# Patient Record
Sex: Male | Born: 1995 | Race: Black or African American | Hispanic: No | Marital: Single | Smoking: Never smoker
Health system: Southern US, Community
[De-identification: ages and names within clinical notes are randomized; demographics above are authoritative.]

## PROBLEM LIST (undated history)

## (undated) DIAGNOSIS — N200 Calculus of kidney: Secondary | ICD-10-CM

## (undated) DIAGNOSIS — Z8619 Personal history of other infectious and parasitic diseases: Secondary | ICD-10-CM

## (undated) DIAGNOSIS — H6690 Otitis media, unspecified, unspecified ear: Secondary | ICD-10-CM

## (undated) HISTORY — DX: Otitis media, unspecified, unspecified ear: H66.90

## (undated) HISTORY — DX: Personal history of other infectious and parasitic diseases: Z86.19

## (undated) HISTORY — PX: NO PAST SURGERIES: SHX2092

## (undated) HISTORY — PX: MYRINGOTOMY: SUR874

---
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
## 2013-07-01 ENCOUNTER — Encounter (HOSPITAL_COMMUNITY): Payer: Self-pay | Admitting: Emergency Medicine

## 2013-07-01 ENCOUNTER — Emergency Department (HOSPITAL_COMMUNITY)
Admission: EM | Admit: 2013-07-01 | Discharge: 2013-07-01 | Disposition: A | Discharge status: Home / Self Care | Payer: Self-pay | Attending: Emergency Medicine | Admitting: Emergency Medicine

## 2013-07-01 ENCOUNTER — Emergency Department (HOSPITAL_COMMUNITY): Payer: BC Managed Care – PPO

## 2013-07-01 DIAGNOSIS — N200 Calculus of kidney: Secondary | ICD-10-CM | POA: Insufficient documentation

## 2013-07-01 DIAGNOSIS — Z8619 Personal history of other infectious and parasitic diseases: Secondary | ICD-10-CM | POA: Insufficient documentation

## 2013-07-01 DIAGNOSIS — N390 Urinary tract infection, site not specified: Secondary | ICD-10-CM | POA: Insufficient documentation

## 2013-07-01 DIAGNOSIS — Z8669 Personal history of other diseases of the nervous system and sense organs: Secondary | ICD-10-CM | POA: Insufficient documentation

## 2013-07-01 LAB — URINE MICROSCOPIC-ADD ON

## 2013-07-01 LAB — URINALYSIS, ROUTINE W REFLEX MICROSCOPIC
Glucose, UA: NEGATIVE mg/dL
KETONES UR: 15 mg/dL — AB
NITRITE: POSITIVE — AB
PH: 5.5 (ref 5.0–8.0)
PROTEIN: 100 mg/dL — AB
SPECIFIC GRAVITY, URINE: 1.026 (ref 1.005–1.030)
Urobilinogen, UA: 1 mg/dL (ref 0.0–1.0)

## 2013-07-01 LAB — URINE CULTURE
CULTURE: NO GROWTH
Colony Count: NO GROWTH
SPECIAL REQUESTS: NORMAL

## 2013-07-01 LAB — CBC
HEMATOCRIT: 39.3 % (ref 39.0–52.0)
HEMOGLOBIN: 13.3 g/dL (ref 13.0–17.0)
MCH: 30.5 pg (ref 26.0–34.0)
MCHC: 33.8 g/dL (ref 30.0–36.0)
MCV: 90.1 fL (ref 78.0–100.0)
Platelets: 214 10*3/uL (ref 150–400)
RBC: 4.36 MIL/uL (ref 4.22–5.81)
RDW: 13.1 % (ref 11.5–15.5)
WBC: 6.5 10*3/uL (ref 4.0–10.5)

## 2013-07-01 LAB — BASIC METABOLIC PANEL
BUN: 11 mg/dL (ref 6–23)
CO2: 25 mEq/L (ref 19–32)
CREATININE: 0.92 mg/dL (ref 0.50–1.35)
Calcium: 9.5 mg/dL (ref 8.4–10.5)
Chloride: 101 mEq/L (ref 96–112)
GFR calc Af Amer: >90 mL/min (ref >90)
GLUCOSE: 135 mg/dL — AB (ref 70–99)
POTASSIUM: 3.3 meq/L — AB (ref 3.7–5.3)
Sodium: 139 mEq/L (ref 137–147)

## 2013-07-01 MED ORDER — HYDROCODONE-ACETAMINOPHEN 5-325 MG PO TABS: 1.0000 | ORAL_TABLET | Freq: Four times a day (QID) | ORAL | Status: DC | PRN

## 2013-07-01 MED ORDER — KETOROLAC TROMETHAMINE 30 MG/ML IJ SOLN
30.0000 mg | Freq: Once | INTRAMUSCULAR | Status: AC
  Administered 2013-07-01: 30 mg via INTRAVENOUS
  Filled 2013-07-01: qty 1

## 2013-07-01 MED ORDER — TAMSULOSIN HCL 0.4 MG PO CAPS: 0.4000 mg | ORAL_CAPSULE | Freq: Every day | ORAL | Status: DC

## 2013-07-01 MED ORDER — ONDANSETRON HCL 4 MG/2ML IJ SOLN
4.0000 mg | Freq: Once | INTRAMUSCULAR | Status: AC
  Administered 2013-07-01: 4 mg via INTRAVENOUS
  Filled 2013-07-01: qty 2

## 2013-07-01 MED ORDER — CIPROFLOXACIN HCL 500 MG PO TABS: 500.0000 mg | ORAL_TABLET | Freq: Two times a day (BID) | ORAL | Status: DC

## 2013-07-01 MED ORDER — CIPROFLOXACIN HCL 500 MG PO TABS
500.0000 mg | ORAL_TABLET | Freq: Once | ORAL | Status: AC
  Administered 2013-07-01: 500 mg via ORAL
  Filled 2013-07-01: qty 1

## 2013-07-01 MED ORDER — ACETAMINOPHEN 500 MG PO TABS
1000.0000 mg | ORAL_TABLET | Freq: Once | ORAL | Status: AC
  Administered 2013-07-01: 1000 mg via ORAL
  Filled 2013-07-01: qty 2

## 2013-07-01 MED ORDER — DEXTROSE 5 % IV SOLN
1.0000 g | Freq: Once | INTRAVENOUS | Status: AC
  Administered 2013-07-01: 1 g via INTRAVENOUS
  Filled 2013-07-01: qty 10

## 2013-07-01 NOTE — ED Notes (Signed)
Pt c/o of lower abd pain that started this morning, state she has been urinating dark red blood this morning. Pt actively vomiting in triage, first time vomiting. Marland Kitchen

## 2013-07-01 NOTE — Discharge Instructions (Signed)
Kidney Stones Kidney stones (urolithiasis) are deposits that form inside your kidneys. The intense pain is caused by the stone moving through the urinary tract. When the stone moves, the ureter goes into spasm around the stone. The stone is usually passed in the urine.  CAUSES   A disorder that makes certain neck glands produce too much parathyroid hormone (primary hyperparathyroidism).  A buildup of uric acid crystals, similar to gout in your joints.  Narrowing (stricture) of the ureter.  A kidney obstruction present at birth (congenital obstruction).  Previous surgery on the kidney or ureters.  Numerous kidney infections. SYMPTOMS   Feeling sick to your stomach (nauseous).  Throwing up (vomiting).  Blood in the urine (hematuria).  Pain that usually spreads (radiates) to the groin.  Frequency or urgency of urination. DIAGNOSIS   Taking a history and physical exam.  Blood or urine tests.  CT scan.  Occasionally, an examination of the inside of the urinary bladder (cystoscopy) is performed. TREATMENT   Observation.  Increasing your fluid intake.  Extracorporeal shock wave lithotripsy This is a noninvasive procedure that uses shock waves to break up kidney stones.  Surgery may be needed if you have severe pain or persistent obstruction. There are various surgical procedures. Most of the procedures are performed with the use of small instruments. Only small incisions are needed to accommodate these instruments, so recovery time is minimized. The size, location, and chemical composition are all important variables that will determine the proper choice of action for you. Talk to your health care provider to better understand your situation so that you will minimize the risk of injury to yourself and your kidney.  HOME CARE INSTRUCTIONS   Drink enough water and fluids to keep your urine clear or pale yellow. This will help you to pass the stone or stone fragments.  Strain  all urine through the provided strainer. Keep all particulate matter and stones for your health care provider to see. The stone causing the pain may be as small as a grain of salt. It is very important to use the strainer each and every time you pass your urine. The collection of your stone will allow your health care provider to analyze it and verify that a stone has actually passed. The stone analysis will often identify what you can do to reduce the incidence of recurrences.  Only take over-the-counter or prescription medicines for pain, discomfort, or fever as directed by your health care provider.  Make a follow-up appointment with your health care provider as directed.  Get follow-up X-rays if required. The absence of pain does not always mean that the stone has passed. It may have only stopped moving. If the urine remains completely obstructed, it can cause loss of kidney function or even complete destruction of the kidney. It is your responsibility to make sure X-rays and follow-ups are completed. Ultrasounds of the kidney can show blockages and the status of the kidney. Ultrasounds are not associated with any radiation and can be performed easily in a matter of minutes. SEEK MEDICAL CARE IF:  You experience pain that is progressive and unresponsive to any pain medicine you have been prescribed. SEEK IMMEDIATE MEDICAL CARE IF:   Pain cannot be controlled with the prescribed medicine.  You have a fever or shaking chills.  The severity or intensity of pain increases over 18 hours and is not relieved by pain medicine.  You develop a new onset of abdominal pain.  You feel faint or pass   out.  You are unable to urinate. MAKE SURE YOU:   Understand these instructions.  Will watch your condition.  Will get help right away if you are not doing well or get worse. Document Released: 01/24/2005 Document Revised: 09/26/2012 Document Reviewed: 06/27/2012 [employer] Patient Information 2014  [employer] Urinary Tract Infection Urinary tract infections (UTIs) can develop anywhere along your urinary tract. Your urinary tract is your body's drainage system for removing wastes and extra water. Your urinary tract includes two kidneys, two ureters, a bladder, and a urethra. Your kidneys are a pair of bean-shaped organs. Each kidney is about the size of your fist. They are located below your ribs, one on each side of your spine. CAUSES Infections are caused by microbes, which are microscopic organisms, including fungi, viruses, and bacteria. These organisms are so small that they can only be seen through a microscope. Bacteria are the microbes that most commonly cause UTIs. SYMPTOMS  Symptoms of UTIs may vary by age and gender of the patient and by the location of the infection. Symptoms in young women typically include a frequent and intense urge to urinate and a painful, burning feeling in the bladder or urethra during urination. Older women and men are more likely to be tired, shaky, and weak and have muscle aches and abdominal pain. A fever may mean the infection is in your kidneys. Other symptoms of a kidney infection include pain in your back or sides below the ribs, nausea, and vomiting. DIAGNOSIS To diagnose a UTI, your caregiver will ask you about your symptoms. Your caregiver also will ask to provide a urine sample. The urine sample will be tested for bacteria and white blood cells. White blood cells are made by your body to help fight infection. TREATMENT  Typically, UTIs can be treated with medication. Because most UTIs are caused by a bacterial infection, they usually can be treated with the use of antibiotics. The choice of antibiotic and length of treatment depend on your symptoms and the type of bacteria causing your infection. HOME CARE INSTRUCTIONS  If you were prescribed antibiotics, take them exactly as your caregiver instructs you. Finish the medication even if you feel  better after you have only taken some of the medication.  Drink enough water and fluids to keep your urine clear or pale yellow.  Avoid caffeine, tea, and carbonated beverages. They tend to irritate your bladder.  Empty your bladder often. Avoid holding urine for long periods of time.  Empty your bladder before and after sexual intercourse.  After a bowel movement, women should cleanse from front to back. Use each tissue only once. SEEK MEDICAL CARE IF:   You have back pain.  You develop a fever.  Your symptoms do not begin to resolve within 3 days. SEEK IMMEDIATE MEDICAL CARE IF:   You have severe back pain or lower abdominal pain.  You develop chills.  You have nausea or vomiting.  You have continued burning or discomfort with urination. MAKE SURE YOU:   Understand these instructions.  Will watch your condition.  Will get help right away if you are not doing well or get worse. Document Released: 11/03/2004 Document Revised: 07/26/2011 Document Reviewed: 03/04/2011 [employer] Patient Information 2014 [employer]  

## 2013-07-01 NOTE — ED Provider Notes (Signed)
CSN: [id number]     Arrival date & time 07/01/13  0825 History   First MD Initiated Contact with Patient 07/01/13 [phone]     Chief Complaint  Patient presents with  . Hematuria  . Abdominal Pain     (Consider location/radiation/quality/duration/timing/severity/associated sxs/prior Treatment) HPI Comments: Hematuria began this morning. Started as yellow urine, then mild worsening with blood. Urine in room not frank blood.  Also having lower abdominal pain, suprapubic, across lower abdomen. Stated some mild R sided lower back pain this morning, which is now gone. Denies any prior back pain, dysuria, hematuria. Went to bed last night without any problems. No family hx of kidney stones. Patient without any current or chronic medical issues.  Patient is a 18 y.o. male presenting with hematuria and abdominal pain. The history is provided by the patient.  Hematuria This is a new problem. The current episode started 3 to 5 hours ago. The problem occurs constantly. The problem has been gradually worsening. Associated symptoms include abdominal pain (lower abdomen). Pertinent negatives include no chest pain, no headaches and no shortness of breath. Nothing aggravates the symptoms. Nothing relieves the symptoms. He has tried nothing for the symptoms.  Abdominal Pain Pain location:  Suprapubic, LLQ and RLQ Pain quality: aching   Pain radiates to:  Does not radiate Associated symptoms: hematuria, nausea and vomiting (x1, just prior to arrival)   Associated symptoms: no chest pain, no constipation, no diarrhea, no fever and no shortness of breath     Past Medical History  Diagnosis Date  . Otitis media     hospitalized as young child  . History of chicken pox     age 70   Past Surgical History  Procedure Laterality Date  . No past surgeries    . Myringotomy      age 78   Family History  Problem Relation Age of Onset  . Diabetes Maternal Grandmother   . Hypertension Maternal Grandmother   .  Arthritis Maternal Grandmother   . Heart murmur Maternal Grandmother   . Heart disease Neg Hx   . Stroke Neg Hx   . Cancer Other     prostate   History  Substance Use Topics  . Smoking status: Never Smoker   . Smokeless tobacco: Never Used  . Alcohol Use: No    Review of Systems  Constitutional: Negative for fever.  Respiratory: Negative for shortness of breath.   Cardiovascular: Negative for chest pain.  Gastrointestinal: Positive for nausea, vomiting (x1, just prior to arrival) and abdominal pain (lower abdomen). Negative for diarrhea, constipation and blood in stool.  Genitourinary: Positive for hematuria.  Neurological: Negative for headaches.  All other systems reviewed and are negative.     Allergies  Review of patient's allergies indicates no known allergies.  Home Medications   Prior to Admission medications   Not on File   BP 151/90  Pulse 71  Resp 22  SpO2 100% Physical Exam  Nursing note and vitals reviewed. Constitutional: He is oriented to person, place, and time. He appears well-developed and well-nourished. No distress.  HENT:  Head: Normocephalic and atraumatic.  Mouth/Throat: No oropharyngeal exudate.  Eyes: EOM are normal. Pupils are equal, round, and reactive to light.  Neck: Normal range of motion. Neck supple.  Cardiovascular: Normal rate and regular rhythm.  Exam reveals no friction rub.   No murmur heard. Pulmonary/Chest: Effort normal and breath sounds normal. No respiratory distress. He has no wheezes. He has no rales.  Abdominal: Soft. He exhibits no distension. There is tenderness (suprapubic, mild bilateral lower quadrants). There is no rebound.  Genitourinary: Testes normal and penis normal. Right testis shows no mass, no swelling and no tenderness. Right testis is descended. Left testis shows no mass, no swelling and no tenderness. Left testis is descended.  Musculoskeletal: Normal range of motion. He exhibits no edema.  Neurological:  He is alert and oriented to person, place, and time.  Skin: He is not diaphoretic.    ED Course  Procedures (including critical care time) Labs Review Labs Reviewed  BASIC METABOLIC PANEL - Abnormal; Notable for the following:    Potassium 3.3 (*)    Glucose, Bld 135 (*)    All other components within normal limits  URINALYSIS, ROUTINE W REFLEX MICROSCOPIC - Abnormal; Notable for the following:    Color, Urine RED (*)    APPearance TURBID (*)    Hgb urine dipstick LARGE (*)    Bilirubin Urine MODERATE (*)    Ketones, ur 15 (*)    Protein, ur 100 (*)    Nitrite POSITIVE (*)    Leukocytes, UA MODERATE (*)    All other components within normal limits  URINE MICROSCOPIC-ADD ON - Abnormal; Notable for the following:    Bacteria, UA FEW (*)    All other components within normal limits  URINE CULTURE  CBC    Imaging Review Ct Abdomen Pelvis Wo Contrast  07/01/2013   CLINICAL DATA:  Lower abdominal pain.  Hematuria.  EXAM: CT ABDOMEN AND PELVIS WITHOUT CONTRAST  TECHNIQUE: Multidetector CT imaging of the abdomen and pelvis was performed following the standard protocol without IV contrast.  COMPARISON:  Ultrasound dated 07/01/2013  FINDINGS: There is a 4 mm stone obstructing the distal right ureter just proximal to the ureterovesical junction. There is faint calcification in the medullary pyramids bilaterally suggesting medullary sponge kidney. There is a 3 mm stone in the mid left kidney.  Liver, biliary tree, spleen, pancreas, and adrenal glands are normal. There is a tiny amount of free fluid in the pelvis. Bowel is normal. No osseous abnormality.  IMPRESSION: 1. 4 mm stone obstructing the distal right ureter. 2. 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 3. 3 mm stone in the mid left kidney.   Electronically Signed   By: Geanie CooleyJim  Maxwell M.D.   On: 07/01/2013 10:10   [place] Renal  07/01/2013   CLINICAL DATA:  Hematuria and abdominal pain evaluate for hydronephrosis  EXAM: RENAL/URINARY TRACT ULTRASOUND  COMPLETE  COMPARISON:  None.  FINDINGS: Right Kidney:  Length: 11.9 cm.  Mild right hydronephrosis.  No focal solid lesion.  Left Kidney:  Length: 10.8 cm. Echogenicity within normal limits. No mass or hydronephrosis visualized.  Bladder:  The bladder is empty. The patient voided immediately prior to the examination.  IMPRESSION: Mild right-sided hydronephrosis. In the setting of abdominal pain and hematuria, this is concerning for an at least partially obstructing ureteral stone. A CT scan of the abdomen and pelvis is ordered and pending.   Electronically Signed   By: Malachy MoanHeath  McCullough M.D.   On: 07/01/2013 09:54     EKG Interpretation None      MDM   Final diagnoses:  Kidney stone  UTI (lower urinary tract infection)    77M presents with lower abdominal pain. Began this morning. Suprapubic, with some hematuria. States pain across lower abdomen. Had mild R lower back pain this morning, gone now. Associated vomiting x1, just prior to arrival. No fevers. AFVSS here.  On exam, GU exam normal. Suprapubic pain, mild lower abdominal pain in right and left lower quadrants. No epigastric pain.  Concern for possible kidney stone. Will start with [place] of kidneys to look for possible hydronephrosis, check his urine and basic labs.  CT shows 4mm R UPJ stone. Possible medullary sponge kidney also. I spoke with Dr. Brunilda Payor, recommends antibiotics, f/u with him later this week. Will give antibiotics, flomax, pain meds. Stable for discharge.  I have reviewed all labs and imaging and considered them in my medical decision making.   Dagmar Hait, MD 07/01/13 1537

## 2013-07-01 NOTE — ED Notes (Signed)
US at bedside

## 2014-02-11 ENCOUNTER — Emergency Department (HOSPITAL_COMMUNITY)
Admission: EM | Admit: 2014-02-11 | Discharge: 2014-02-11 | Disposition: A | Discharge status: Home / Self Care | Payer: Medicaid Other | Attending: Dermatology | Admitting: Dermatology

## 2014-02-11 ENCOUNTER — Encounter (HOSPITAL_COMMUNITY): Payer: Self-pay | Admitting: Emergency Medicine

## 2014-02-11 DIAGNOSIS — Z8669 Personal history of other diseases of the nervous system and sense organs: Secondary | ICD-10-CM | POA: Insufficient documentation

## 2014-02-11 DIAGNOSIS — R112 Nausea with vomiting, unspecified: Secondary | ICD-10-CM

## 2014-02-11 DIAGNOSIS — Z792 Long term (current) use of antibiotics: Secondary | ICD-10-CM | POA: Insufficient documentation

## 2014-02-11 DIAGNOSIS — Z87442 Personal history of urinary calculi: Secondary | ICD-10-CM | POA: Insufficient documentation

## 2014-02-11 DIAGNOSIS — K529 Noninfective gastroenteritis and colitis, unspecified: Secondary | ICD-10-CM

## 2014-02-11 DIAGNOSIS — Z79899 Other long term (current) drug therapy: Secondary | ICD-10-CM | POA: Insufficient documentation

## 2014-02-11 DIAGNOSIS — Z8619 Personal history of other infectious and parasitic diseases: Secondary | ICD-10-CM | POA: Insufficient documentation

## 2014-02-11 HISTORY — DX: Calculus of kidney: N20.0

## 2014-02-11 LAB — COMPREHENSIVE METABOLIC PANEL
ALBUMIN: 4.8 g/dL (ref 3.5–5.2)
ALT: 17 U/L (ref 0–53)
ANION GAP: 11 (ref 5–15)
AST: 32 U/L (ref 0–37)
Alkaline Phosphatase: 108 U/L (ref 39–117)
BILIRUBIN TOTAL: 1.7 mg/dL — AB (ref 0.3–1.2)
BUN: 12 mg/dL (ref 6–23)
CO2: 23 mmol/L (ref 19–32)
CREATININE: 0.93 mg/dL (ref 0.50–1.35)
Calcium: 9.7 mg/dL (ref 8.4–10.5)
Chloride: 103 mEq/L (ref 96–112)
GLUCOSE: 90 mg/dL (ref 70–99)
Potassium: 3.8 mmol/L (ref 3.5–5.1)
SODIUM: 137 mmol/L (ref 135–145)
Total Protein: 8.6 g/dL — ABNORMAL HIGH (ref 6.0–8.3)

## 2014-02-11 LAB — CBC WITH DIFFERENTIAL/PLATELET
Basophils Absolute: 0 10*3/uL (ref 0.0–0.1)
Basophils Relative: 0 % (ref 0–1)
EOS PCT: 0 % (ref 0–5)
Eosinophils Absolute: 0 10*3/uL (ref 0.0–0.7)
HEMATOCRIT: 39.1 % (ref 39.0–52.0)
HEMOGLOBIN: 13.2 g/dL (ref 13.0–17.0)
LYMPHS PCT: 9 % — AB (ref 12–46)
Lymphs Abs: 0.6 10*3/uL — ABNORMAL LOW (ref 0.7–4.0)
MCH: 30 pg (ref 26.0–34.0)
MCHC: 33.8 g/dL (ref 30.0–36.0)
MCV: 88.9 fL (ref 78.0–100.0)
MONO ABS: 0.4 10*3/uL (ref 0.1–1.0)
MONOS PCT: 7 % (ref 3–12)
NEUTROS ABS: 5.1 10*3/uL (ref 1.7–7.7)
Neutrophils Relative %: 84 % — ABNORMAL HIGH (ref 43–77)
Platelets: 229 10*3/uL (ref 150–400)
RBC: 4.4 MIL/uL (ref 4.22–5.81)
RDW: 13.1 % (ref 11.5–15.5)
WBC: 6.1 10*3/uL (ref 4.0–10.5)

## 2014-02-11 LAB — LIPASE, BLOOD: Lipase: 21 U/L (ref 11–59)

## 2014-02-11 MED ORDER — ONDANSETRON HCL 4 MG/2ML IJ SOLN
4.0000 mg | Freq: Once | INTRAMUSCULAR | Status: AC
  Administered 2014-02-11: 4 mg via INTRAVENOUS
  Filled 2014-02-11: qty 2

## 2014-02-11 MED ORDER — SODIUM CHLORIDE 0.9 % IV BOLUS (SEPSIS)
1000.0000 mL | Freq: Once | INTRAVENOUS | Status: AC
  Administered 2014-02-11: 1000 mL via INTRAVENOUS

## 2014-02-11 MED ORDER — HYDROMORPHONE HCL 1 MG/ML IJ SOLN
0.5000 mg | Freq: Once | INTRAMUSCULAR | Status: AC
  Administered 2014-02-11: 0.5 mg via INTRAVENOUS
  Filled 2014-02-11: qty 1

## 2014-02-11 MED ORDER — OXYCODONE-ACETAMINOPHEN 5-325 MG PO TABS: 1.0000 | ORAL_TABLET | Freq: Once | ORAL | Status: DC

## 2014-02-11 MED ORDER — ONDANSETRON 4 MG PO TBDP
8.0000 mg | ORAL_TABLET | Freq: Once | ORAL | Status: AC
  Administered 2014-02-11: 8 mg via ORAL
  Filled 2014-02-11: qty 2

## 2014-02-11 MED ORDER — PANTOPRAZOLE SODIUM 40 MG IV SOLR
40.0000 mg | Freq: Once | INTRAVENOUS | Status: AC
  Administered 2014-02-11: 40 mg via INTRAVENOUS
  Filled 2014-02-11: qty 40

## 2014-02-11 NOTE — ED Notes (Signed)
Cup of orange juice given to pt per request.  Sts nausea is better.

## 2014-02-11 NOTE — ED Provider Notes (Signed)
CSN: [id number]     Arrival date & time 02/11/14  1406 History   First MD Initiated Contact with Patient 02/11/14 1742     Chief Complaint  Patient presents with  . Emesis     (Consider location/radiation/quality/duration/timing/severity/associated sxs/prior Treatment) Patient is a 19 y.o. male presenting with vomiting. The history is provided by the patient and a relative.  Emesis Associated symptoms: no abdominal pain, no diarrhea, no headaches and no sore throat   pt c/o several episodes nv onset today.  Emesis clear to yellowish, not bloody or bilious. No diarrhea. Had normal bm yesterday. No abd distension. Crampy, diffuse, intermittent abd pain, that occasionally feels toward mid back.  No hx pud. No hx gallstones or pancreatitis. No constant and/or focal abd pain. No fever or chills. States had left a subway sandwich out for a few hrs before eating last pm, otherwise, no known bad food ingestion or ill contacts. No prior abd surgery. Denies cough or uri c/o. No chest pain or sob.      Past Medical History  Diagnosis Date  . Otitis media     hospitalized as young child  . History of chicken pox     age 80  . Kidney stones    Past Surgical History  Procedure Laterality Date  . No past surgeries    . Myringotomy      age 73   Family History  Problem Relation Age of Onset  . Diabetes Maternal Grandmother   . Hypertension Maternal Grandmother   . Arthritis Maternal Grandmother   . Heart murmur Maternal Grandmother   . Heart disease Neg Hx   . Stroke Neg Hx   . Cancer Other     prostate   History  Substance Use Topics  . Smoking status: Never Smoker   . Smokeless tobacco: Never Used  . Alcohol Use: No    Review of Systems  Constitutional: Negative for fever.  HENT: Negative for sore throat.   Eyes: Negative for redness.  Respiratory: Negative for cough and shortness of breath.   Cardiovascular: Negative for chest pain.  Gastrointestinal: Positive for nausea and  vomiting. Negative for abdominal pain, diarrhea, constipation and abdominal distention.  Endocrine: Negative for polyuria.  Genitourinary: Negative for dysuria, hematuria, flank pain, scrotal swelling and testicular pain.  Musculoskeletal: Negative for back pain and neck pain.  Skin: Negative for rash.  Neurological: Negative for headaches.  Hematological: Does not bruise/bleed easily.  Psychiatric/Behavioral: Negative for confusion.      Allergies  Review of patient's allergies indicates no known allergies.  Home Medications   Prior to Admission medications   Medication Sig Start Date End Date Taking? Authorizing Provider  ciprofloxacin (CIPRO) 500 MG tablet Take 1 tablet (500 mg total) by mouth 2 (two) times daily. 07/01/13   Elwin Mocha, MD  HYDROcodone-acetaminophen (NORCO/VICODIN) 5-325 MG per tablet Take 1 tablet by mouth every 6 (six) hours as needed for moderate pain. 07/01/13   Elwin Mocha, MD  tamsulosin (FLOMAX) 0.4 MG CAPS capsule Take 1 capsule (0.4 mg total) by mouth daily. 07/01/13   Elwin Mocha, MD   BP 99/75 mmHg  Pulse 92  Temp(Src) 99.4 F (37.4 C) (Oral)  Resp 16  SpO2 100% Physical Exam  Constitutional: He is oriented to person, place, and time. He appears well-developed and well-nourished. No distress.  HENT:  Mouth/Throat: Oropharynx is clear and moist.  Eyes: Conjunctivae are normal. No scleral icterus.  Neck: Neck supple. No tracheal deviation present.  No stiffness or rigidity  Cardiovascular: Normal rate, regular rhythm, normal heart sounds and intact distal pulses.  Exam reveals no gallop and no friction rub.   No murmur heard. Pulmonary/Chest: Effort normal and breath sounds normal. No accessory muscle usage. No respiratory distress.  Abdominal: Soft. Bowel sounds are normal. He exhibits no distension and no mass. There is tenderness. There is no rebound and no guarding.  Mild epigastric tenderness. No hernias.   Genitourinary:  No cva tenderness   Musculoskeletal: Normal range of motion.  Neurological: He is alert and oriented to person, place, and time.  Skin: Skin is warm and dry. He is not diaphoretic.  Psychiatric: He has a normal mood and affect.  Nursing note and vitals reviewed.   ED Course  Procedures (including critical care time) Labs Review  Results for orders placed or performed during the hospital encounter of 02/11/14  Comprehensive metabolic panel  Result Value Ref Range   Sodium 137 135 - 145 mmol/L   Potassium 3.8 3.5 - 5.1 mmol/L   Chloride 103 96 - 112 mEq/L   CO2 23 19 - 32 mmol/L   Glucose, Bld 90 70 - 99 mg/dL   BUN 12 6 - 23 mg/dL   Creatinine, Ser 1.610.93 0.50 - 1.35 mg/dL   Calcium 9.7 8.4 - 09.610.5 mg/dL   Total Protein 8.6 (H) 6.0 - 8.3 g/dL   Albumin 4.8 3.5 - 5.2 g/dL   AST 32 0 - 37 U/L   ALT 17 0 - 53 U/L   Alkaline Phosphatase 108 39 - 117 U/L   Total Bilirubin 1.7 (H) 0.3 - 1.2 mg/dL   GFR calc non Af Amer >90 >90 mL/min   GFR calc Af Amer >90 >90 mL/min   Anion gap 11 5 - 15  CBC with Differential  Result Value Ref Range   WBC 6.1 4.0 - 10.5 K/uL   RBC 4.40 4.22 - 5.81 MIL/uL   Hemoglobin 13.2 13.0 - 17.0 g/dL   HCT 04.539.1 40.939.0 - 81.152.0 %   MCV 88.9 78.0 - 100.0 fL   MCH 30.0 26.0 - 34.0 pg   MCHC 33.8 30.0 - 36.0 g/dL   RDW 91.413.1 78.211.5 - 95.615.5 %   Platelets 229 150 - 400 K/uL   Neutrophils Relative % 84 (H) 43 - 77 %   Neutro Abs 5.1 1.7 - 7.7 K/uL   Lymphocytes Relative 9 (L) 12 - 46 %   Lymphs Abs 0.6 (L) 0.7 - 4.0 K/uL   Monocytes Relative 7 3 - 12 %   Monocytes Absolute 0.4 0.1 - 1.0 K/uL   Eosinophils Relative 0 0 - 5 %   Eosinophils Absolute 0.0 0.0 - 0.7 K/uL   Basophils Relative 0 0 - 1 %   Basophils Absolute 0.0 0.0 - 0.1 K/uL  Lipase, blood  Result Value Ref Range   Lipase 21 11 - 59 U/L     MDM   Labs.  Iv ns bolus.  Dilaudid .5 mg iv. zofran iv. protonix iv.  Reviewed nursing notes and prior charts for additional history.   Recheck, no pain. Abd soft nt.   Tolerating po fluids. Feels improved.  Pt currently appears stable for d/c.      Suzi RootsKevin E Danyel Griess, MD 02/11/14 [id number]

## 2014-02-11 NOTE — ED Notes (Signed)
Pt tolerating PO fluids. Denies N/V at this time.

## 2014-02-11 NOTE — Discharge Instructions (Signed)
It was our pleasure to provide your ER care today - we hope that you feel better.  Rest. Drink plenty of fluids.  Advance diet slowly as tolerated.  Follow up with primary care doctor, or here, in 1 days time for recheck if symptoms fail to improve/resolve.  Return to ER if worse, new symptoms, worsening or severe abdominal pain, persistent vomiting, other concern.  You were given pain medication in the ER - no driving for the next 4 hours.     Nausea and Vomiting Nausea is a sick feeling that often comes before throwing up (vomiting). Vomiting is a reflex where stomach contents come out of your mouth. Vomiting can cause severe loss of body fluids (dehydration). Children and elderly adults can become dehydrated quickly, especially if they also have diarrhea. Nausea and vomiting are symptoms of a condition or disease. It is important to find the cause of your symptoms. CAUSES   Direct irritation of the stomach lining. This irritation can result from increased acid production (gastroesophageal reflux disease), infection, food poisoning, taking certain medicines (such as nonsteroidal anti-inflammatory drugs), alcohol use, or tobacco use.  Signals from the brain.These signals could be caused by a headache, heat exposure, an inner ear disturbance, increased pressure in the brain from injury, infection, a tumor, or a concussion, pain, emotional stimulus, or metabolic problems.  An obstruction in the gastrointestinal tract (bowel obstruction).  Illnesses such as diabetes, hepatitis, gallbladder problems, appendicitis, kidney problems, cancer, sepsis, atypical symptoms of a heart attack, or eating disorders.  Medical treatments such as chemotherapy and radiation.  Receiving medicine that makes you sleep (general anesthetic) during surgery. DIAGNOSIS Your caregiver may ask for tests to be done if the problems do not improve after a few days. Tests may also be done if symptoms are severe or if  the reason for the nausea and vomiting is not clear. Tests may include:  Urine tests.  Blood tests.  Stool tests.  Cultures (to look for evidence of infection).  X-rays or other imaging studies. Test results can help your caregiver make decisions about treatment or the need for additional tests. TREATMENT You need to stay well hydrated. Drink frequently but in small amounts.You may wish to drink water, sports drinks, clear broth, or eat frozen ice pops or gelatin dessert to help stay hydrated.When you eat, eating slowly may help prevent nausea.There are also some antinausea medicines that may help prevent nausea. HOME CARE INSTRUCTIONS   Take all medicine as directed by your caregiver.  If you do not have an appetite, do not force yourself to eat. However, you must continue to drink fluids.  If you have an appetite, eat a normal diet unless your caregiver tells you differently.  Eat a variety of complex carbohydrates (rice, wheat, potatoes, bread), lean meats, yogurt, fruits, and vegetables.  Avoid high-fat foods because they are more difficult to digest.  Drink enough water and fluids to keep your urine clear or pale yellow.  If you are dehydrated, ask your caregiver for specific rehydration instructions. Signs of dehydration may include:  Severe thirst.  Dry lips and mouth.  Dizziness.  Dark urine.  Decreasing urine frequency and amount.  Confusion.  Rapid breathing or pulse. SEEK IMMEDIATE MEDICAL CARE IF:   You have blood or brown flecks (like coffee grounds) in your vomit.  You have black or bloody stools.  You have a severe headache or stiff neck.  You are confused.  You have severe abdominal pain.  You have chest  pain or trouble breathing.  You do not urinate at least once every 8 hours.  You develop cold or clammy skin.  You continue to vomit for longer than 24 to 48 hours.  You have a fever. MAKE SURE YOU:   Understand these  instructions.  Will watch your condition.  Will get help right away if you are not doing well or get worse. Document Released: 01/24/2005 Document Revised: 04/18/2011 Document Reviewed: 06/23/2010 [facility] Patient Information 2015 [place], [place]. This information is not intended to replace advice given to you by your health care provider. Make sure you discuss any questions you have with your health care provider.    Viral Gastroenteritis Viral gastroenteritis is also known as stomach flu. This condition affects the stomach and intestinal tract. It can cause sudden diarrhea and vomiting. The illness typically lasts 3 to 8 days. Most people develop an immune response that eventually gets rid of the virus. While this natural response develops, the virus can make you quite ill. CAUSES  Many different viruses can cause gastroenteritis, such as rotavirus or noroviruses. You can catch one of these viruses by consuming contaminated food or water. You may also catch a virus by sharing utensils or other personal items with an infected person or by touching a contaminated surface. SYMPTOMS  The most common symptoms are diarrhea and vomiting. These problems can cause a severe loss of body fluids (dehydration) and a body salt (electrolyte) imbalance. Other symptoms may include:  Fever.  Headache.  Fatigue.  Abdominal pain. DIAGNOSIS  Your caregiver can usually diagnose viral gastroenteritis based on your symptoms and a physical exam. A stool sample may also be taken to test for the presence of viruses or other infections. TREATMENT  This illness typically goes away on its own. Treatments are aimed at rehydration. The most serious cases of viral gastroenteritis involve vomiting so severely that you are not able to keep fluids down. In these cases, fluids must be given through an intravenous line (IV). HOME CARE INSTRUCTIONS   Drink enough fluids to keep your urine clear or pale yellow. Drink small  amounts of fluids frequently and increase the amounts as tolerated.  Ask your caregiver for specific rehydration instructions.  Avoid:  Foods high in sugar.  Alcohol.  Carbonated drinks.  Tobacco.  Juice.  Caffeine drinks.  Extremely hot or cold fluids.  Fatty, greasy foods.  Too much intake of anything at one time.  Dairy products until 24 to 48 hours after diarrhea stops.  You may consume probiotics. Probiotics are active cultures of beneficial bacteria. They may lessen the amount and number of diarrheal stools in adults. Probiotics can be found in yogurt with active cultures and in supplements.  Wash your hands well to avoid spreading the virus.  Only take over-the-counter or prescription medicines for pain, discomfort, or fever as directed by your caregiver. Do not give aspirin to children. Antidiarrheal medicines are not recommended.  Ask your caregiver if you should continue to take your regular prescribed and over-the-counter medicines.  Keep all follow-up appointments as directed by your caregiver. SEEK IMMEDIATE MEDICAL CARE IF:   You are unable to keep fluids down.  You do not urinate at least once every 6 to 8 hours.  You develop shortness of breath.  You notice blood in your stool or vomit. This may look like coffee grounds.  You have abdominal pain that increases or is concentrated in one small area (localized).  You have persistent vomiting or diarrhea.  You have a fever.  The patient is a child younger than 3 months, and he or she has a fever.  The patient is a child older than 3 months, and he or she has a fever and persistent symptoms.  The patient is a child older than 3 months, and he or she has a fever and symptoms suddenly get worse.  The patient is a baby, and he or she has no tears when crying. MAKE SURE YOU:   Understand these instructions.  Will watch your condition.  Will get help right away if you are not doing well or get  worse. Document Released: 01/24/2005 Document Revised: 04/18/2011 Document Reviewed: 11/10/2010 [facility] Patient Information 2015 [place], [place]. This information is not intended to replace advice given to you by your health care provider. Make sure you discuss any questions you have with your health care provider.

## 2014-02-11 NOTE — ED Notes (Signed)
Pt has been vomiting, diaphoretic, chills, anorexic. This started this morning around 0400.

## 2014-05-01 ENCOUNTER — Telehealth: Payer: Self-pay | Admitting: Family

## 2014-05-01 ENCOUNTER — Ambulatory Visit (INDEPENDENT_AMBULATORY_CARE_PROVIDER_SITE_OTHER): Payer: BLUE CROSS/BLUE SHIELD | Admitting: Family

## 2014-05-01 ENCOUNTER — Encounter: Payer: Self-pay | Admitting: Family

## 2014-05-01 VITALS — BP 109/66 | HR 47 | Temp 98.2°F | Resp 16 | Ht 71.0 in | Wt 160.8 lb

## 2014-05-01 DIAGNOSIS — K219 Gastro-esophageal reflux disease without esophagitis: Secondary | ICD-10-CM

## 2014-05-01 DIAGNOSIS — R110 Nausea: Secondary | ICD-10-CM

## 2014-05-01 DIAGNOSIS — D72819 Decreased white blood cell count, unspecified: Secondary | ICD-10-CM

## 2014-05-01 DIAGNOSIS — R001 Bradycardia, unspecified: Secondary | ICD-10-CM | POA: Insufficient documentation

## 2014-05-01 LAB — BASIC METABOLIC PANEL
BUN: 14 mg/dL (ref 6–23)
CO2: 27 meq/L (ref 19–32)
CREATININE: 0.94 mg/dL (ref 0.40–1.50)
Calcium: 9.5 mg/dL (ref 8.4–10.5)
Chloride: 106 mEq/L (ref 96–112)
GFR: 130.8 mL/min (ref >60.00)
Glucose, Bld: 93 mg/dL (ref 70–99)
Potassium: 4.6 mEq/L (ref 3.5–5.1)
Sodium: 138 mEq/L (ref 135–145)

## 2014-05-01 LAB — CBC
HCT: 41.8 % (ref 39.0–52.0)
HEMOGLOBIN: 14 g/dL (ref 13.0–17.0)
MCHC: 33.4 g/dL (ref 30.0–36.0)
MCV: 90.5 fl (ref 78.0–100.0)
Platelets: 223 10*3/uL (ref 150.0–400.0)
RBC: 4.62 Mil/uL (ref 4.22–5.81)
RDW: 13.8 % (ref 11.5–14.6)
WBC: 2.6 10*3/uL — AB (ref 4.5–10.5)

## 2014-05-01 LAB — TSH: TSH: 0.8 u[IU]/mL (ref 0.35–5.50)

## 2014-05-01 MED ORDER — RANITIDINE HCL 150 MG PO TABS: 150.0000 mg | ORAL_TABLET | Freq: Two times a day (BID) | ORAL | Status: DC

## 2014-05-01 NOTE — Progress Notes (Signed)
   Subjective:    Patient ID: Thomas Stanley, male    DOB: June 04, 1995, 19 y.o.   MRN: [id number]  HPI   Pt present today with report of episode of Diaphoresis and nausea with standing. Reports symptoms lasts 8-9 minutes. Reports intermittent "bubbly" "hot" abdominal discomfort. Denies associated vomitting, nausea or dizziness.  Yesterday had several episodes of diaphoresis with standing.  Reports that he went to the ED on 1/5 with vomiting. Notes that every time he stood up he would vomit during that episode. Symptoms then resolved until yesterday.   This time symptoms are less severe, just nausea- no vomiting with standing. Reports Normal stool.  Denies bloody/coffee ground emesis.    Only eats one time a day (fast food)   Review of Systems See HPI  Past Medical History  Diagnosis Date  . Otitis media     hospitalized as young child  . History of chicken pox     age 89  . Kidney stones     History   Social History  . Marital Status: Single    Spouse Name: N/A  . Number of Children: N/A  . Years of Education: N/A   Occupational History  . Not on file.   Social History Main Topics  . Smoking status: Never Smoker   . Smokeless tobacco: Never Used  . Alcohol Use: No  . Drug Use: No  . Sexual Activity: No     Comment: senior in high school, basektball, track   Other Topics Concern  . Not on file   Social History Narrative   Regular exercise:  Plays basketball 2-3 days weekly   Trying to get into [employer]   Not currently working   Lives with mom, step dad, older brother             Past Surgical History  Procedure Laterality Date  . No past surgeries    . Myringotomy      age 19    Family History  Problem Relation Age of Onset  . Diabetes Maternal Grandmother   . Hypertension Maternal Grandmother   . Arthritis Maternal Grandmother   . Heart murmur Maternal Grandmother   . Heart disease Neg Hx   . Stroke Neg Hx   . Cancer Other     prostate    No Known  Allergies  No current outpatient prescriptions on file prior to visit.   No current facility-administered medications on file prior to visit.    BP 109/66 mmHg  Pulse 47  Temp(Src) 98.2 F (36.8 C) (Oral)  Resp 16  Ht 5\' 11"  (1.803 m)  Wt 160 lb 12.8 oz (72.938 kg)  BMI 22.44 kg/m2  SpO2 99%       Objective:   Physical Exam  Constitutional: He is oriented to person, place, and time. He appears well-developed and well-nourished. No distress.  HENT:  Head: Normocephalic and atraumatic.  Cardiovascular: Regular rhythm.  Bradycardia present.   No murmur heard. Pulmonary/Chest: Effort normal and breath sounds normal. No respiratory distress. He has no wheezes. He has no rales. He exhibits no tenderness.  Neurological: He is alert and oriented to person, place, and time.  Skin: Skin is warm and dry.  Psychiatric: He has a normal mood and affect. His behavior is normal. Judgment and thought content normal.          Assessment & Plan:  No significant orthostasis noted today

## 2014-05-01 NOTE — Assessment & Plan Note (Signed)
Trial of zantac 

## 2014-05-01 NOTE — Patient Instructions (Signed)
Make sure you drink 64 ounce of water a day, and eat 3 well balanced meals and healthy snacks. Start zantac 150mg  twice daily. Complete lab work prior to leaving. Follow up in 1 month.

## 2014-05-01 NOTE — Assessment & Plan Note (Signed)
?   Etiology. 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  Will refer to cardiology for further evaluation. Will also check baseline cbc, tsh, bmet.  We did discuss importance of regular well balanced meals, snacks.  I have asked pt to work on this and return to see Thomas Stanley in 1 month.

## 2014-05-01 NOTE — Progress Notes (Signed)
Pre visit review using our clinic review tool, if applicable. No additional management support is needed unless otherwise documented below in the visit note. 

## 2014-05-01 NOTE — Telephone Encounter (Signed)
Spoke with pt. Advised him that EKG showing bradycardia and I would like for him to meet with cardiology for further evaluation. He is agreeable.

## 2014-05-05 NOTE — Addendum Note (Signed)
Addended by: Noreene LarssonLARSON, Kalim Kissel A on: 05/05/2014 05:46 PM   Modules accepted: Orders

## 2014-06-02 ENCOUNTER — Ambulatory Visit: Payer: BLUE CROSS/BLUE SHIELD | Admitting: Family

## 2014-06-02 DIAGNOSIS — Z0289 Encounter for other administrative examinations: Secondary | ICD-10-CM

## 2014-06-09 ENCOUNTER — Other Ambulatory Visit: Payer: BLUE CROSS/BLUE SHIELD

## 2014-07-22 ENCOUNTER — Encounter (HOSPITAL_COMMUNITY): Payer: Self-pay | Admitting: Emergency Medicine

## 2014-07-22 ENCOUNTER — Emergency Department (HOSPITAL_COMMUNITY)
Admission: EM | Admit: 2014-07-22 | Discharge: 2014-07-22 | Disposition: A | Discharge status: Home / Self Care | Payer: BLUE CROSS/BLUE SHIELD | Attending: Emergency Medicine | Admitting: Emergency Medicine

## 2014-07-22 DIAGNOSIS — Z8669 Personal history of other diseases of the nervous system and sense organs: Secondary | ICD-10-CM | POA: Insufficient documentation

## 2014-07-22 DIAGNOSIS — Y9389 Activity, other specified: Secondary | ICD-10-CM | POA: Diagnosis not present

## 2014-07-22 DIAGNOSIS — M25511 Pain in right shoulder: Secondary | ICD-10-CM

## 2014-07-22 DIAGNOSIS — S4991XA Unspecified injury of right shoulder and upper arm, initial encounter: Secondary | ICD-10-CM | POA: Insufficient documentation

## 2014-07-22 DIAGNOSIS — Y998 Other external cause status: Secondary | ICD-10-CM | POA: Diagnosis not present

## 2014-07-22 DIAGNOSIS — Y92410 Unspecified street and highway as the place of occurrence of the external cause: Secondary | ICD-10-CM | POA: Diagnosis not present

## 2014-07-22 DIAGNOSIS — Z87442 Personal history of urinary calculi: Secondary | ICD-10-CM | POA: Insufficient documentation

## 2014-07-22 DIAGNOSIS — Z79899 Other long term (current) drug therapy: Secondary | ICD-10-CM | POA: Insufficient documentation

## 2014-07-22 DIAGNOSIS — S3992XA Unspecified injury of lower back, initial encounter: Secondary | ICD-10-CM | POA: Diagnosis not present

## 2014-07-22 DIAGNOSIS — Z8619 Personal history of other infectious and parasitic diseases: Secondary | ICD-10-CM | POA: Diagnosis not present

## 2014-07-22 MED ORDER — METHOCARBAMOL 500 MG PO TABS: 500.0000 mg | ORAL_TABLET | Freq: Two times a day (BID) | ORAL | Status: DC

## 2014-07-22 MED ORDER — NAPROXEN 500 MG PO TABS: 500.0000 mg | ORAL_TABLET | Freq: Two times a day (BID) | ORAL | Status: DC

## 2014-07-22 MED ORDER — HYDROCODONE-ACETAMINOPHEN 5-325 MG PO TABS: 2.0000 | ORAL_TABLET | ORAL | Status: DC | PRN

## 2014-07-22 NOTE — Discharge Instructions (Signed)
Shoulder  Rest. Ice. Elevate. Take naproxen for pain and hydrocodone for breakthrough pain.  The shoulder is the joint that connects your arms to your body. The bones that form the shoulder joint include the upper arm bone (humerus), the shoulder blade (scapula), and the collarbone (clavicle). The top of the humerus is shaped like a ball and fits into a rather flat socket on the scapula (glenoid cavity). A combination of muscles and strong, fibrous tissues that connect muscles to bones (tendons) support your shoulder joint and hold the ball in the socket. Small, fluid-filled sacs (bursae) are located in different areas of the joint. They act as cushions between the bones and the overlying soft tissues and help reduce friction between the gliding tendons and the bone as you move your arm. Your shoulder joint allows a wide range of motion in your arm. This range of motion allows you to do things like scratch your back or throw a ball. However, this range of motion also makes your shoulder more prone to pain from overuse and injury. Causes of shoulder pain can originate from both injury and overuse and usually can be grouped in the following four categories:  Redness, swelling, and pain (inflammation) of the tendon (tendinitis) or the bursae (bursitis).  Instability, such as a dislocation of the joint.  Inflammation of the joint (arthritis).  Broken bone (fracture). HOME CARE INSTRUCTIONS   Apply ice to the sore area.  Put ice in a plastic bag.  Place a towel between your skin and the bag.  Leave the ice on for 15-20 minutes, 3-4 times per day for the first 2 days, or as directed by your health care provider.  Stop using cold packs if they do not help with the pain.  If you have a shoulder sling or immobilizer, wear it as long as your caregiver instructs. Only remove it to shower or bathe. Move your arm as little as possible, but keep your hand moving to prevent swelling.  Squeeze a soft ball  or foam pad as much as possible to help prevent swelling.  Only take over-the-counter or prescription medicines for pain, discomfort, or fever as directed by your caregiver. SEEK MEDICAL CARE IF:   Your shoulder pain increases, or new pain develops in your arm, hand, or fingers.  Your hand or fingers become cold and numb.  Your pain is not relieved with medicines. SEEK IMMEDIATE MEDICAL CARE IF:   Your arm, hand, or fingers are numb or tingling.  Your arm, hand, or fingers are significantly swollen or turn white or blue. MAKE SURE YOU:   Understand these instructions.  Will watch your condition.  Will get help right away if you are not doing well or get worse. Document Released: 11/03/2004 Document Revised: 06/10/2013 Document Reviewed: 01/08/2011 [facility] Patient Information 2015 [place], [place]. This information is not intended to replace advice given to you by your health care provider. Make sure you discuss any questions you have with your health care provider.
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
## 2014-12-04 IMAGING — CT CT ABD-PELV W/O CM
1 of 2 series · 16 of 32 positions shown, 20 images · non-contrast
Comparison: Ultrasound dated 07/01/2013

CLINICAL DATA: Lower abdominal pain.  Hematuria.

EXAM:
CT ABDOMEN AND PELVIS WITHOUT CONTRAST
TECHNIQUE: Multidetector CT imaging of the abdomen and pelvis was performed
following the standard protocol without IV contrast.

[Series 2: abd/pel w/o · axial · non-contrast · 0.70mm/px · z∈[+1096,+1540]mm · 16 of 97 slices shown, 20 images]
[im 4/97  soft-tissue]
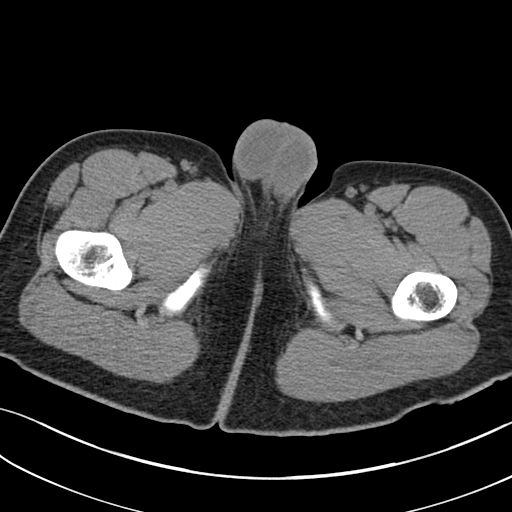
[im 4/97  bone]
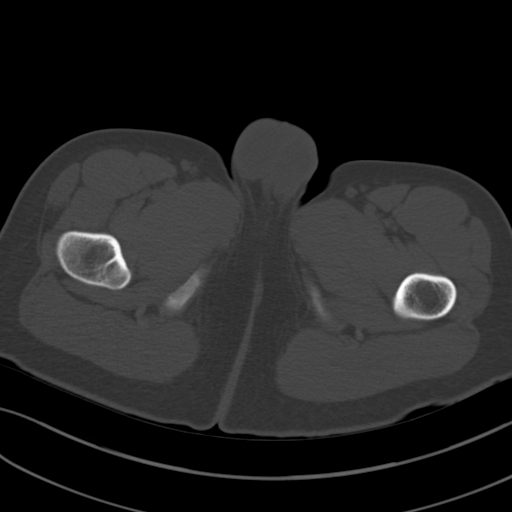
[im 12/97  soft-tissue]
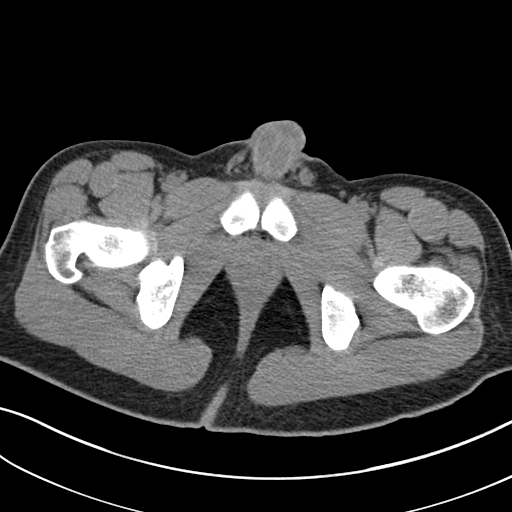
[im 20/97  soft-tissue]
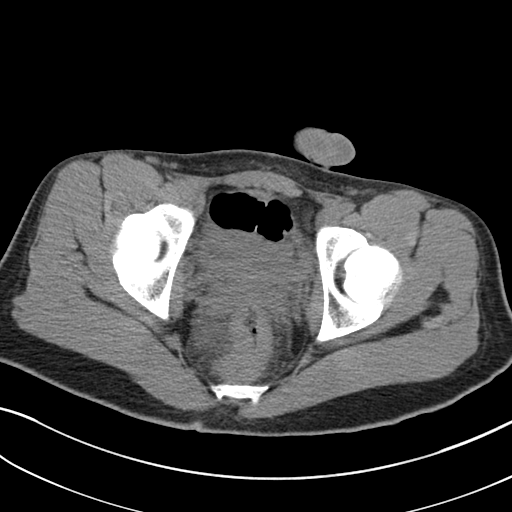
[im 27/97  soft-tissue]
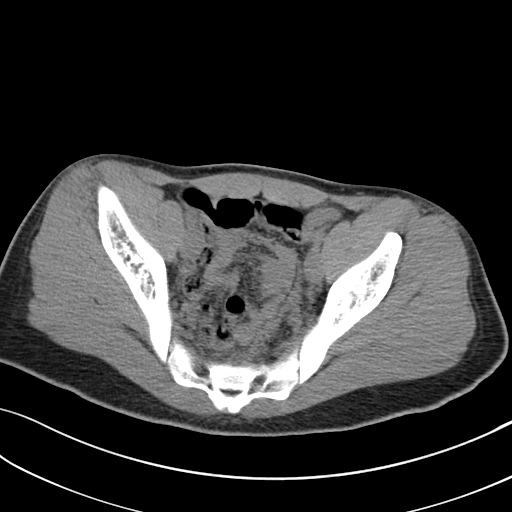
[im 31/97  soft-tissue]
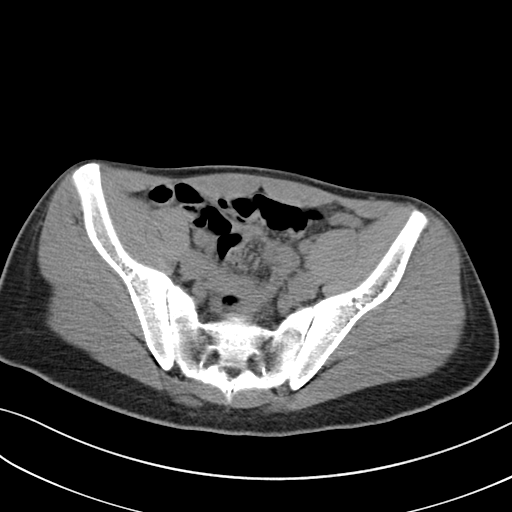
[im 39/97  soft-tissue]
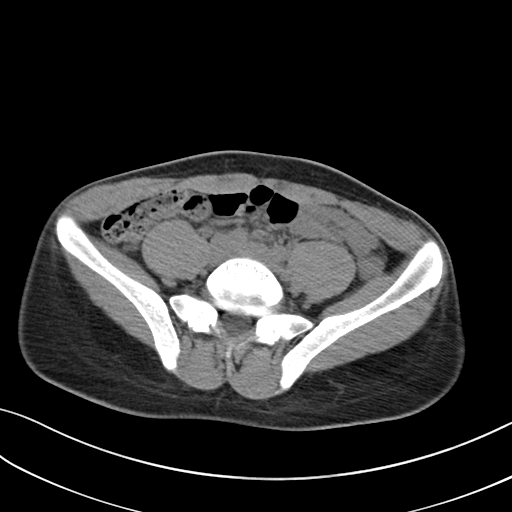
[im 47/97  soft-tissue]
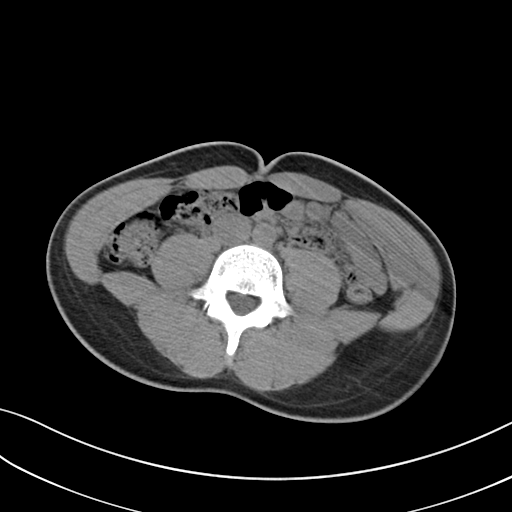
[im 50/97  soft-tissue]
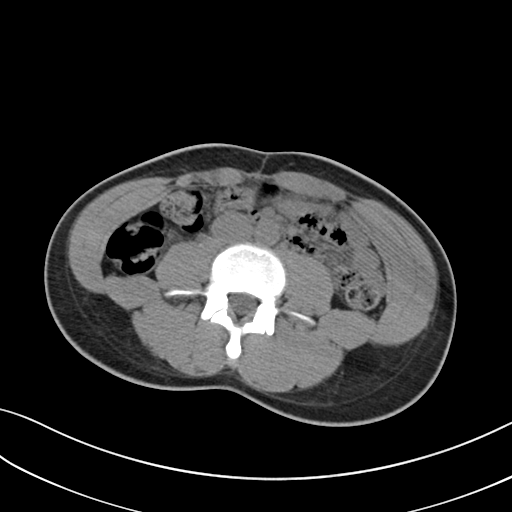
[im 58/97  soft-tissue]
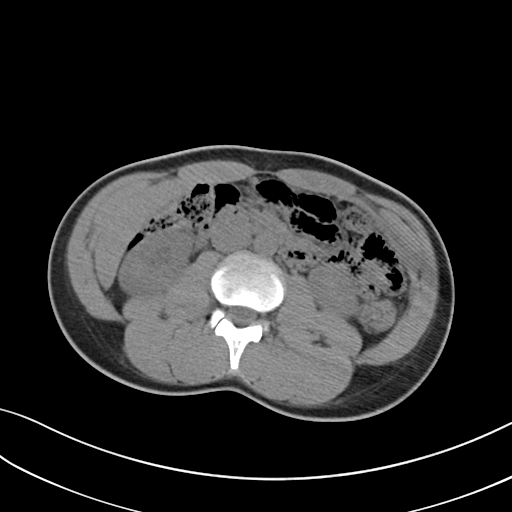
[im 58/97  bone]
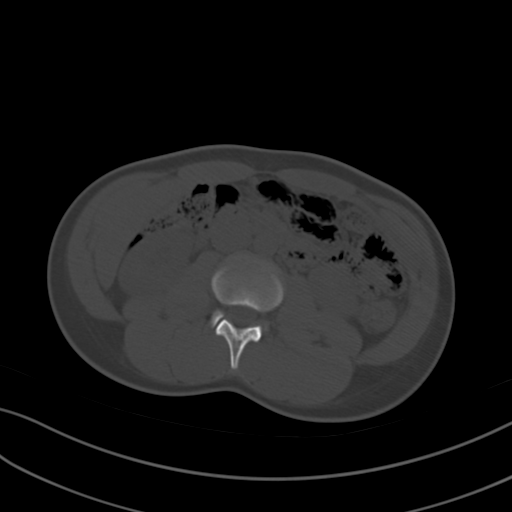
[im 66/97  soft-tissue]
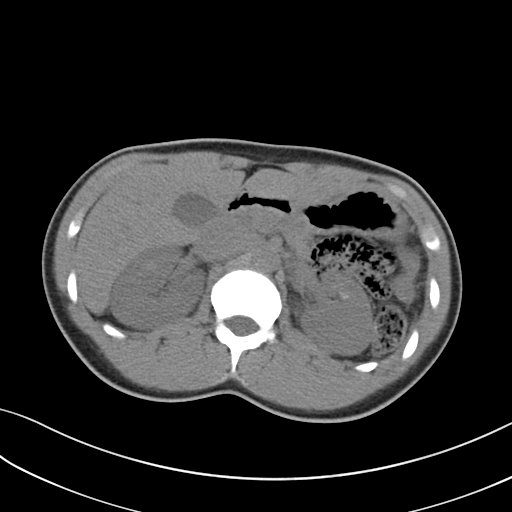
[im 73/97  soft-tissue]
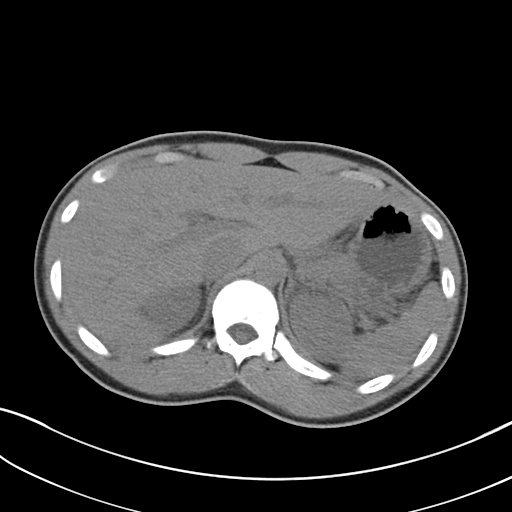
[im 77/97  soft-tissue]
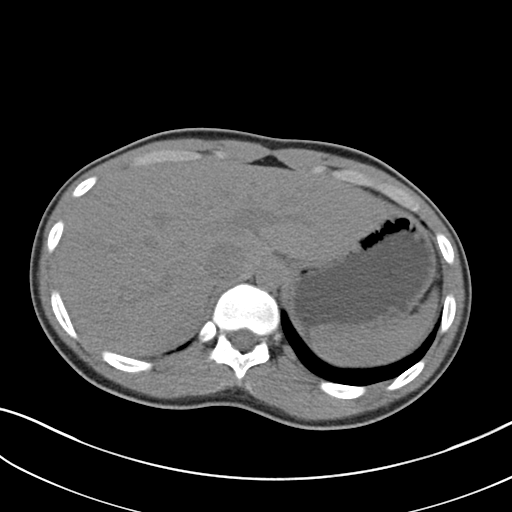
[im 81/97  lung]
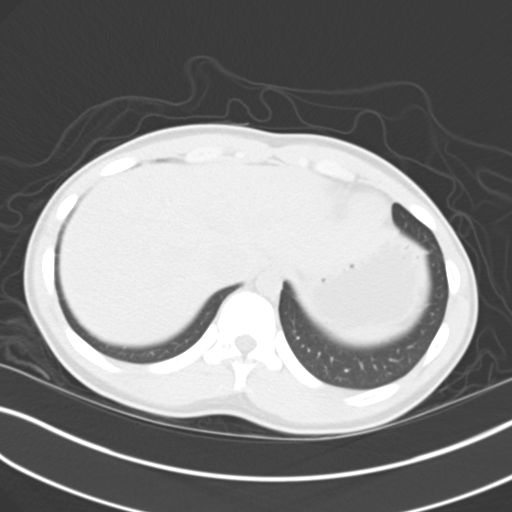
[im 85/97  soft-tissue]
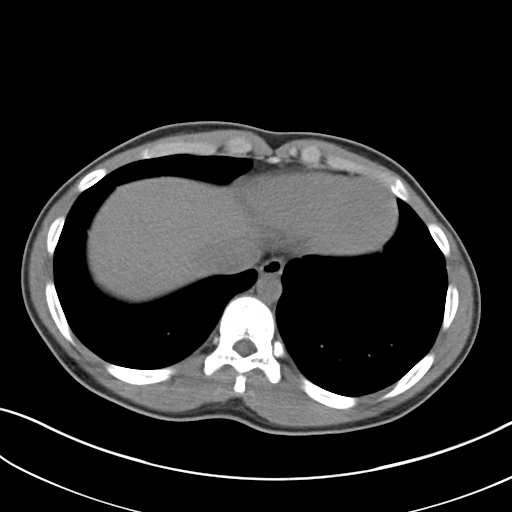
[im 85/97  lung]
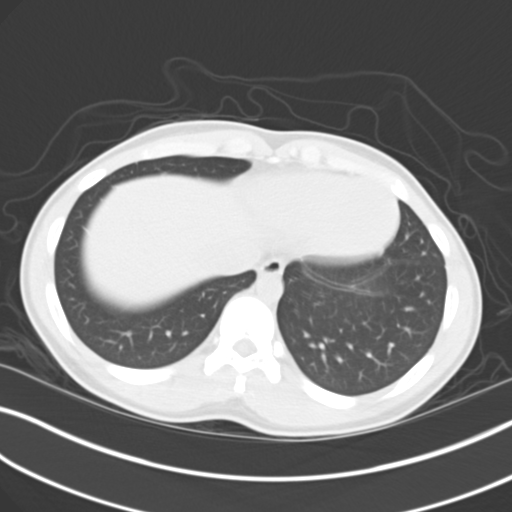
[im 89/97  lung]
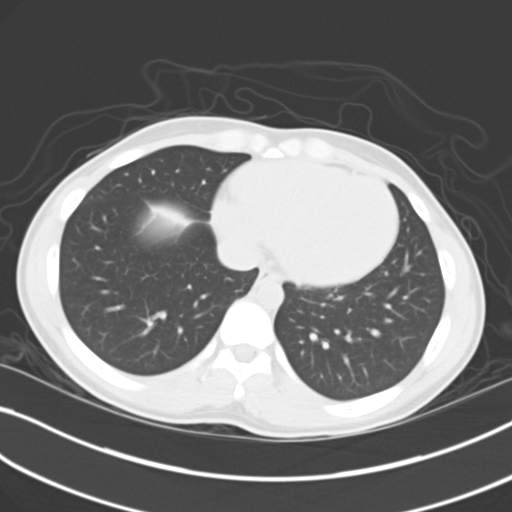
[im 93/97  soft-tissue]
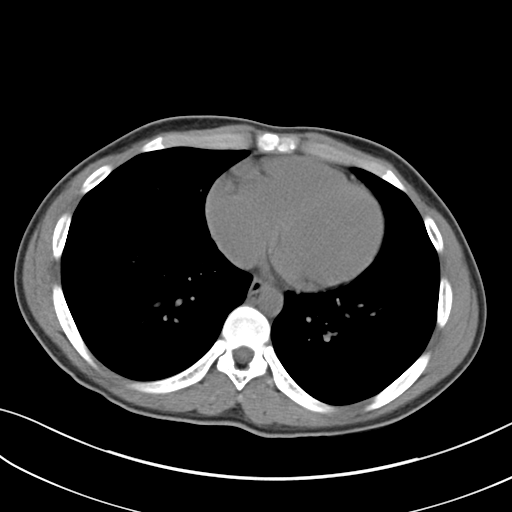
[im 93/97  lung]
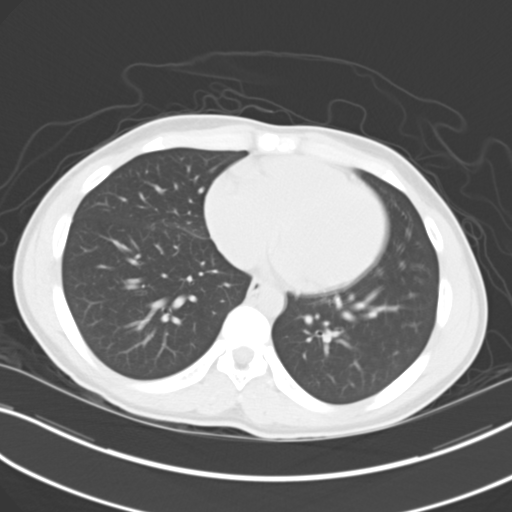

[16 of 32 positions shown; findings below may reference images not displayed]

FINDINGS: There is a 4 mm stone obstructing the distal right ureter just
proximal to the ureterovesical junction. There is faint
calcification in the medullary pyramids bilaterally suggesting
medullary sponge kidney. There is a 3 mm stone in the mid left
kidney.

Liver, biliary tree, spleen, pancreas, and adrenal glands are
normal. There is a tiny amount of free fluid in the pelvis. Bowel is
normal. No osseous abnormality.
IMPRESSION: 1. 4 mm stone obstructing the distal right ureter.
2. Probable medullary sponge kidneys.
3. 3 mm stone in the mid left kidney.

## 2014-12-04 IMAGING — US US RENAL
1 series · 14 of 25 positions shown · non-contrast
Comparison: None.

CLINICAL DATA: Hematuria and abdominal pain evaluate for
hydronephrosis

EXAM:
RENAL/URINARY TRACT ULTRASOUND COMPLETE

[Series 1: us renal · 0.22mm/px · 14 of 30 slices shown]
[im 1/30]
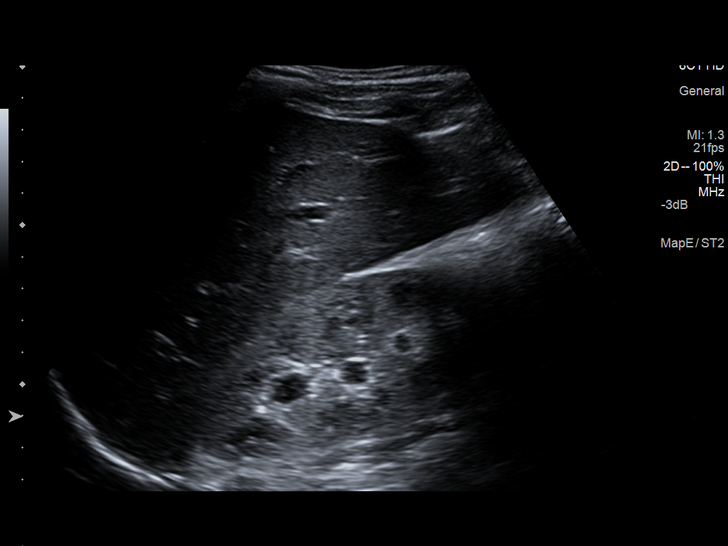
[im 3/30]
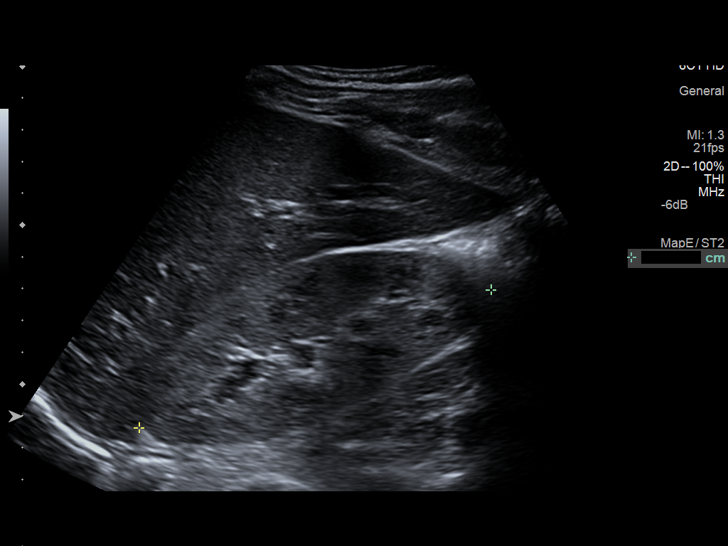
[im 5/30]
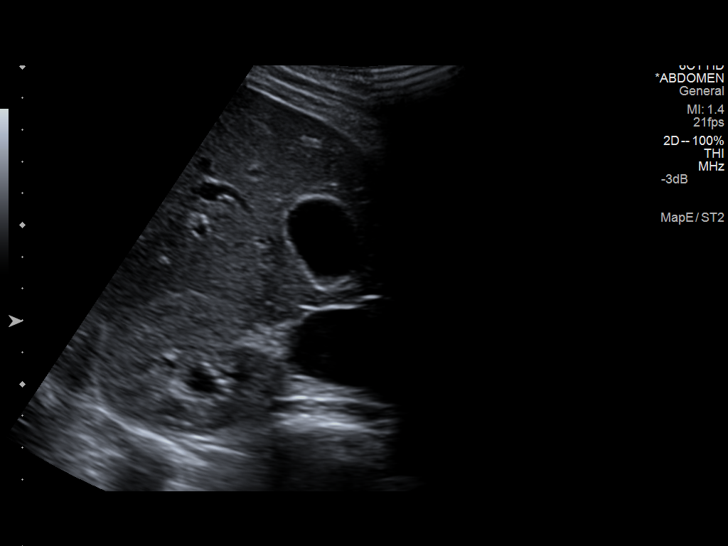
[im 8/30]
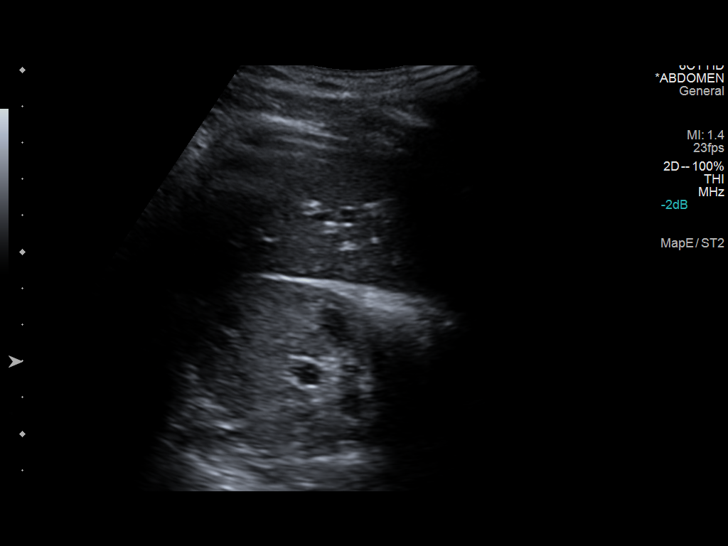
[im 10/30]
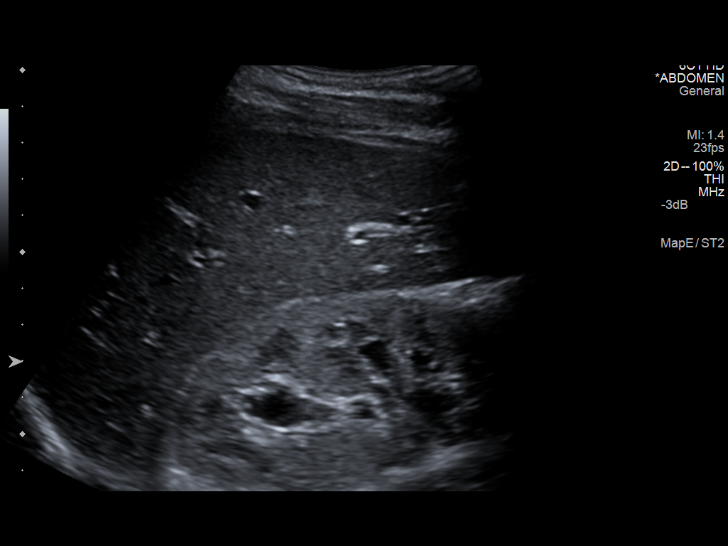
[im 11/30]
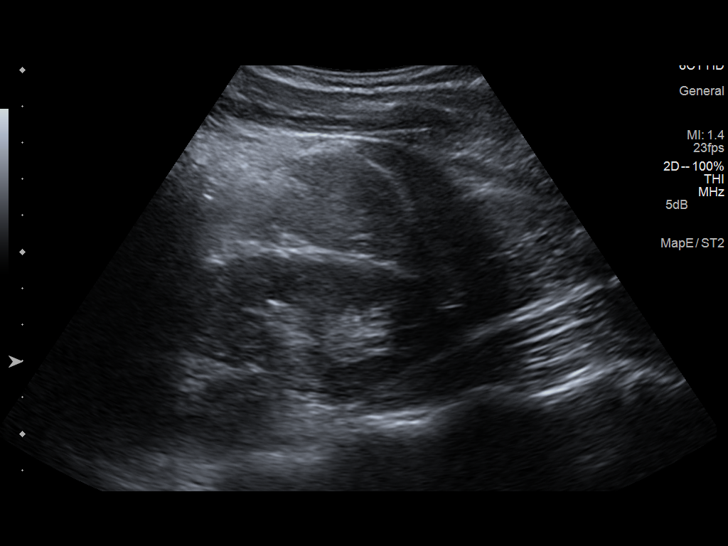
[im 14/30]
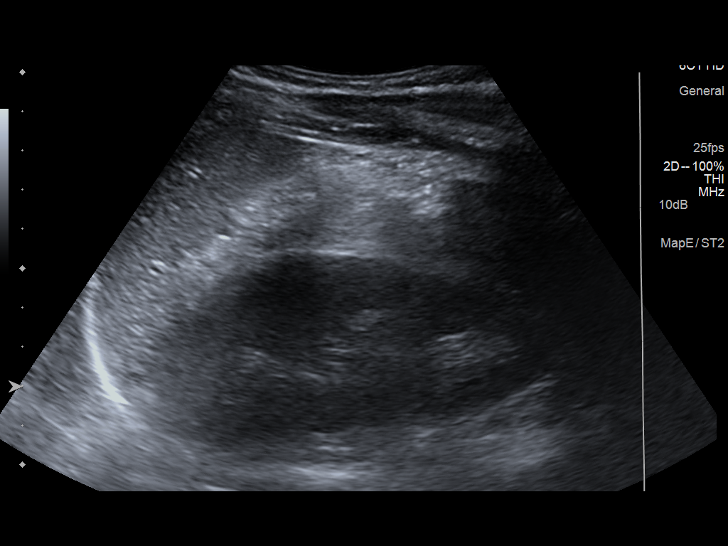
[im 16/30]
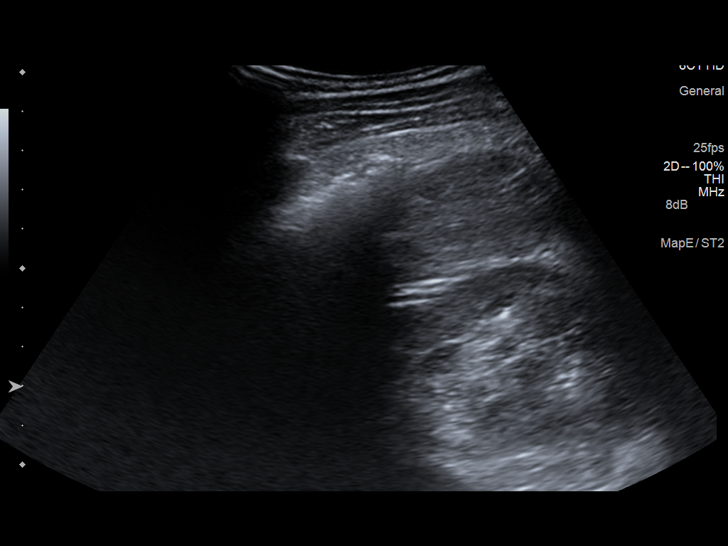
[im 19/30]
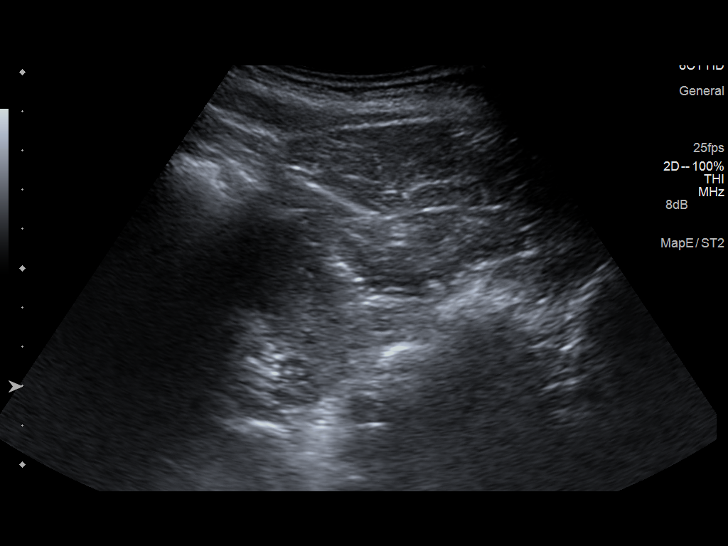
[im 20/30]
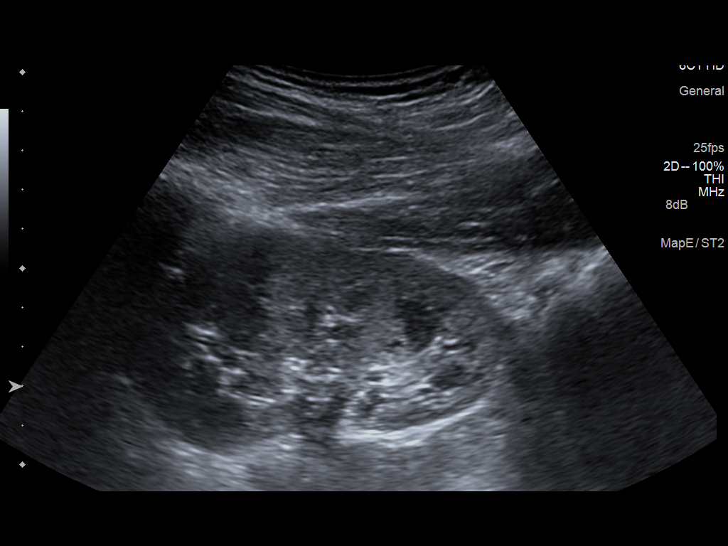
[im 22/30]
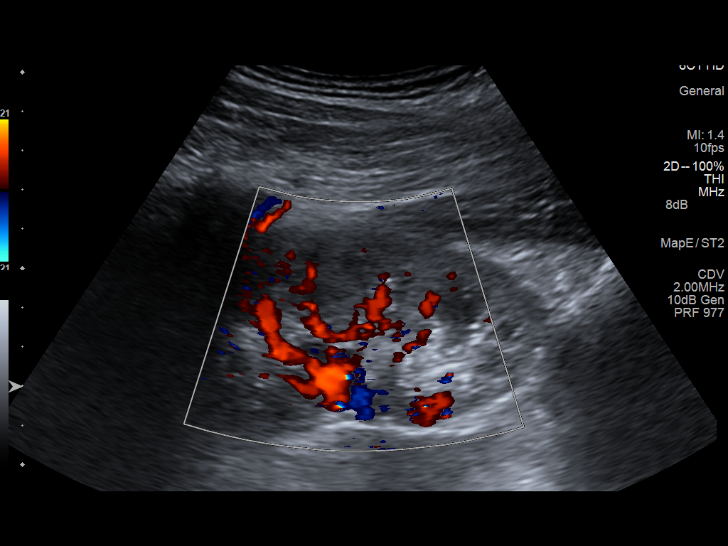
[im 25/30]
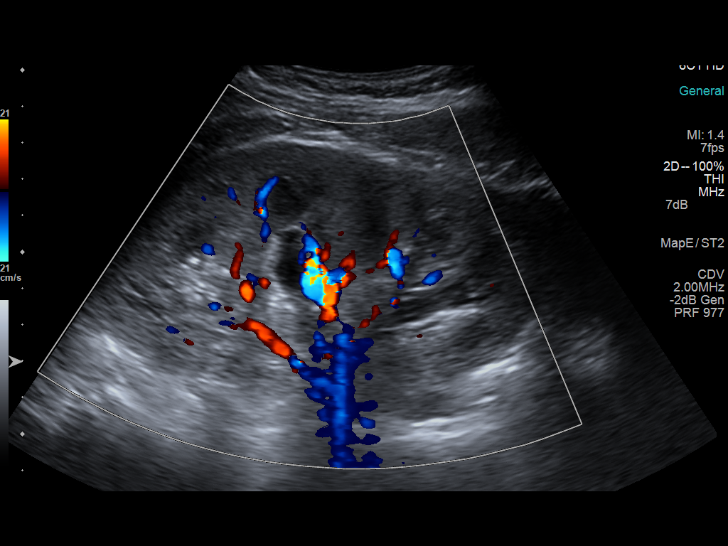
[im 27/30]
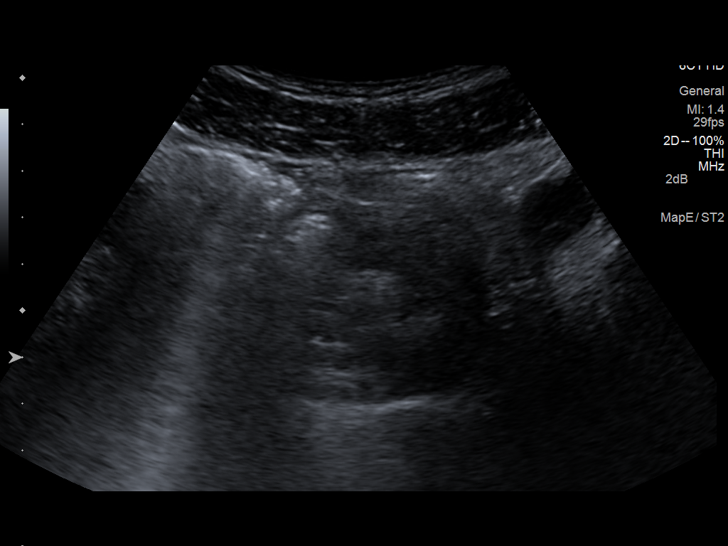
[im 30/30]
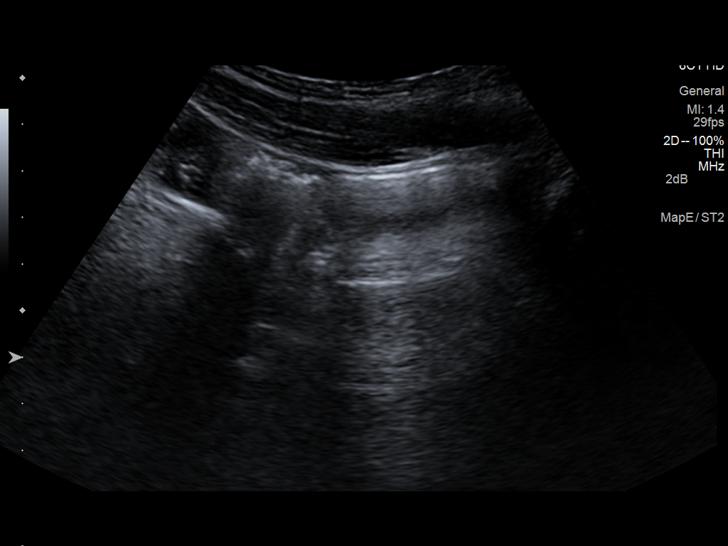

[14 of 25 positions shown; findings below may reference images not displayed]

FINDINGS: Right Kidney:

Length: 11.9 cm.  Mild right hydronephrosis.  No focal solid lesion.

Left Kidney:

Length: 10.8 cm. Echogenicity within normal limits. No mass or
hydronephrosis visualized.

Bladder:

The bladder is empty. The patient voided immediately prior to the
examination.
IMPRESSION: Mild right-sided hydronephrosis. In the setting of abdominal pain
and hematuria, this is concerning for an at least partially
obstructing ureteral stone. A CT scan of the abdomen and pelvis is
ordered and pending.

## 2015-06-17 ENCOUNTER — Emergency Department (HOSPITAL_COMMUNITY)
Admission: EM | Admit: 2015-06-17 | Discharge: 2015-06-17 | Disposition: A | Discharge status: Home / Self Care | Payer: BLUE CROSS/BLUE SHIELD | Attending: Dermatology | Admitting: Dermatology

## 2015-06-17 ENCOUNTER — Encounter (HOSPITAL_COMMUNITY): Payer: Self-pay | Admitting: Emergency Medicine

## 2015-06-17 DIAGNOSIS — E860 Dehydration: Secondary | ICD-10-CM | POA: Insufficient documentation

## 2015-06-17 DIAGNOSIS — R5383 Other fatigue: Secondary | ICD-10-CM | POA: Diagnosis present

## 2015-06-17 LAB — BASIC METABOLIC PANEL
ANION GAP: 8 (ref 5–15)
BUN: 13 mg/dL (ref 6–20)
CALCIUM: 9.3 mg/dL (ref 8.9–10.3)
CHLORIDE: 104 mmol/L (ref 101–111)
CO2: 25 mmol/L (ref 22–32)
Creatinine, Ser: 1.11 mg/dL (ref 0.61–1.24)
GFR calc non Af Amer: >60 mL/min (ref >60)
Glucose, Bld: 97 mg/dL (ref 65–99)
Potassium: 4.2 mmol/L (ref 3.5–5.1)
Sodium: 137 mmol/L (ref 135–145)

## 2015-06-17 LAB — CBC
HCT: 40.8 % (ref 39.0–52.0)
HEMOGLOBIN: 13.9 g/dL (ref 13.0–17.0)
MCH: 30.4 pg (ref 26.0–34.0)
MCHC: 34.1 g/dL (ref 30.0–36.0)
MCV: 89.3 fL (ref 78.0–100.0)
Platelets: 243 10*3/uL (ref 150–400)
RBC: 4.57 MIL/uL (ref 4.22–5.81)
RDW: 13.1 % (ref 11.5–15.5)
WBC: 6.5 10*3/uL (ref 4.0–10.5)

## 2015-06-17 NOTE — ED Notes (Signed)
Pt reports sore throat, nasal stuffiness and generalized body aches for the past 5 days. Pt reports he only drank a bottle of water and orange juice yesterday. Only had a sip of water today. No nv/d. Pt reports he has felt weak and has fallen recently due to weakness.

## 2019-10-29 ENCOUNTER — Other Ambulatory Visit: Payer: BLUE CROSS/BLUE SHIELD

## 2019-10-29 DIAGNOSIS — Z20822 Contact with and (suspected) exposure to covid-19: Secondary | ICD-10-CM

## 2019-10-31 LAB — SARS-COV-2, NAA 2 DAY TAT

## 2019-10-31 LAB — NOVEL CORONAVIRUS, NAA: SARS-CoV-2, NAA: NOT DETECTED

## 2019-11-12 ENCOUNTER — Other Ambulatory Visit: Payer: BLUE CROSS/BLUE SHIELD

## 2019-11-12 DIAGNOSIS — Z20822 Contact with and (suspected) exposure to covid-19: Secondary | ICD-10-CM

## 2019-11-14 LAB — NOVEL CORONAVIRUS, NAA: SARS-CoV-2, NAA: NOT DETECTED

## 2019-11-14 LAB — SARS-COV-2, NAA 2 DAY TAT

## 2019-12-10 ENCOUNTER — Other Ambulatory Visit: Payer: Self-pay

## 2019-12-10 ENCOUNTER — Ambulatory Visit (HOSPITAL_COMMUNITY)
Admission: EM | Admit: 2019-12-10 | Discharge: 2019-12-10 | Disposition: A | Discharge status: Home / Self Care | Payer: Self-pay | Attending: Urgent Care | Admitting: Urgent Care

## 2019-12-10 ENCOUNTER — Encounter (HOSPITAL_COMMUNITY): Payer: Self-pay

## 2019-12-10 DIAGNOSIS — R1032 Left lower quadrant pain: Secondary | ICD-10-CM

## 2019-12-10 DIAGNOSIS — M545 Low back pain, unspecified: Secondary | ICD-10-CM

## 2019-12-10 DIAGNOSIS — R109 Unspecified abdominal pain: Secondary | ICD-10-CM

## 2019-12-10 LAB — POCT URINALYSIS DIPSTICK, ED / UC
Bilirubin Urine: NEGATIVE
Glucose, UA: NEGATIVE mg/dL
Hgb urine dipstick: NEGATIVE
Ketones, ur: NEGATIVE mg/dL
Leukocytes,Ua: NEGATIVE
Nitrite: NEGATIVE
Protein, ur: NEGATIVE mg/dL
Specific Gravity, Urine: 1.02 (ref 1.005–1.030)
Urobilinogen, UA: 0.2 mg/dL (ref 0.0–1.0)
pH: 7 (ref 5.0–8.0)

## 2019-12-10 MED ORDER — NAPROXEN 500 MG PO TABS: 500.0000 mg | ORAL_TABLET | Freq: Two times a day (BID) | ORAL | 0 refills | Status: DC

## 2019-12-10 MED ORDER — TIZANIDINE HCL 4 MG PO TABS: 4.0000 mg | ORAL_TABLET | Freq: Every day | ORAL | 0 refills | Status: DC

## 2019-12-10 NOTE — Discharge Instructions (Signed)
Please make sure that you are hydrating very well with plain water and quantity of 2 to 3 L/day especially if you are working out.  Make sure you try to warm up before doing free weight training, stretching afterwards.  If you develop bloody stools, fevers, nausea, vomiting, blood in the urine, painful urination, worsening flank pain then please return to our clinic for a recheck.  Otherwise you are welcome to use naproxen for pain and inflammation, tizanidine at bedtime for muscle relaxant.  Make sure you take naproxen with food.

## 2019-12-10 NOTE — ED Triage Notes (Signed)
Pt c/o left groin pain radiating to LLQ, and left flank/lower back for approx 4 days. Also reports enlarged veins to left testicle. Describes pain as intermittent and dull/throbbing sensation. States pain has subsided for now.  Denies testicular swelling, rashes, penile discharge, dysuria sx, abnormal urine flow, n/v/d, fever.

## 2019-12-10 NOTE — ED Provider Notes (Signed)
[facility]   MRN: [id number] DOB: Jun 17, 1995  Subjective:   Thomas Stanley is a 24 y.o. male presenting for 4-day history of acute onset intermittent mild to moderate focal pain over the left lower quadrant, left flank and low back.  Patient became worried about feeling what he thought was engorged veins in the left lower testicle as well.  He actually went and got STI testing yesterday and is pending results.  Patient is sexually active, uses condoms for protection.  Denies fever, nausea, vomiting, dysuria, hematuria, penile discharge, testicular pain.  Patient does exercise regularly, does free weights and includes things like squats, dead lifts.  Patient admits that he does not hydrate regularly, does not always warm up or stretch.  No current facility-administered medications for this encounter.  Current Outpatient Medications:  .  HYDROcodone-acetaminophen (NORCO/VICODIN) 5-325 MG per tablet, Take 2 tablets by mouth every 4 (four) hours as needed., Disp: 6 tablet, Rfl: 0 .  methocarbamol (ROBAXIN) 500 MG tablet, Take 1 tablet (500 mg total) by mouth 2 (two) times daily., Disp: 20 tablet, Rfl: 0 .  naproxen (NAPROSYN) 500 MG tablet, Take 1 tablet (500 mg total) by mouth 2 (two) times daily., Disp: 30 tablet, Rfl: 0 .  promethazine (PHENERGAN) 25 MG tablet, Take 25 mg by mouth every 8 (eight) hours as needed for nausea or vomiting. , Disp: , Rfl: 0 .  ranitidine (ZANTAC) 150 MG tablet, Take 1 tablet (150 mg total) by mouth 2 (two) times daily. (Patient not taking: Reported on 07/22/2014), Disp: 60 tablet, Rfl: 0   No Known Allergies  Past Medical History:  Diagnosis Date  . History of chicken pox    age 89  . Kidney stones   . Otitis media    hospitalized as young child     Past Surgical History:  Procedure Laterality Date  . MYRINGOTOMY     age 78  . NO PAST SURGERIES      Family History  Problem Relation Age of Onset  . Cancer Other        prostate  .  Diabetes Maternal Grandmother   . Hypertension Maternal Grandmother   . Arthritis Maternal Grandmother   . Heart murmur Maternal Grandmother   . Heart disease Neg Hx   . Stroke Neg Hx     Social History   Tobacco Use  . Smoking status: Never Smoker  . Smokeless tobacco: Never Used  Substance Use Topics  . Alcohol use: No  . Drug use: No    ROS   Objective:   Vitals: BP 138/81 (BP Location: Right Wrist)   Pulse (!) 50   Temp 98.4 F (36.9 C) (Oral)   Resp 16   SpO2 100%   Physical Exam Constitutional:      General: He is not in acute distress.    Appearance: Normal appearance. He is well-developed and normal weight. He is not ill-appearing, toxic-appearing or diaphoretic.  HENT:     Head: Normocephalic and atraumatic.     Right Ear: External ear normal.     Left Ear: External ear normal.     Nose: Nose normal.     Mouth/Throat:     Pharynx: Oropharynx is clear.  Eyes:     General: No scleral icterus.       Right eye: No discharge.        Left eye: No discharge.     Extraocular Movements: Extraocular movements intact.  Pupils: Pupils are equal, round, and reactive to light.  Cardiovascular:     Rate and Rhythm: Normal rate.  Pulmonary:     Effort: Pulmonary effort is normal.  Abdominal:     General: Bowel sounds are normal. There is no distension.     Palpations: Abdomen is soft. There is no mass.     Tenderness: There is no abdominal tenderness. There is no right CVA tenderness, left CVA tenderness, guarding or rebound.     Hernia: There is no hernia in the left inguinal area or right inguinal area.  Genitourinary:    Testes:        Right: Mass, tenderness, swelling, testicular hydrocele or varicocele not present. Right testis is descended.        Left: Mass, tenderness, swelling, testicular hydrocele or varicocele not present. Left testis is descended.     Epididymis:     Right: Not inflamed or enlarged. No mass or tenderness.     Left: Not inflamed  or enlarged. No mass or tenderness.  Musculoskeletal:     Cervical back: Normal range of motion.  Lymphadenopathy:     Lower Body: No right inguinal adenopathy. No left inguinal adenopathy.  Neurological:     Mental Status: He is alert and oriented to person, place, and time.  Psychiatric:        Mood and Affect: Mood normal.        Behavior: Behavior normal.        Thought Content: Thought content normal.        Judgment: Judgment normal.     Results for orders placed or performed during the hospital encounter of 12/10/19 (from the past 24 hour(s))  POC Urinalysis dipstick     Status: None   Collection Time: 12/10/19 12:55 PM  Result Value Ref Range   Glucose, UA NEGATIVE NEGATIVE mg/dL   Bilirubin Urine NEGATIVE NEGATIVE   Ketones, ur NEGATIVE NEGATIVE mg/dL   Specific Gravity, Urine 1.020 1.005 - 1.030   Hgb urine dipstick NEGATIVE NEGATIVE   pH 7.0 5.0 - 8.0   Protein, ur NEGATIVE NEGATIVE mg/dL   Urobilinogen, UA 0.2 0.0 - 1.0 mg/dL   Nitrite NEGATIVE NEGATIVE   Leukocytes,Ua NEGATIVE NEGATIVE    Assessment and Plan :   PDMP not reviewed this encounter.  1. Left lower quadrant abdominal pain   2. Left flank pain   3. Acute left-sided low back pain without sciatica     Exam and vital signs very reassuring, normal.  Normal urinalysis.  STI testing pending through a different clinic.  Reassured patient, recommended naproxen and tizanidine, better hydration.  Counseled on importance of stretching and warming up as it relates to his exercise. Counseled patient on potential for adverse effects with medications prescribed/recommended today, ER and return-to-clinic precautions discussed, patient verbalized understanding.    Wallis Bamberg, PA-C 12/10/19 1305

## 2021-09-23 ENCOUNTER — Ambulatory Visit
Admission: EM | Admit: 2021-09-23 | Discharge: 2021-09-23 | Disposition: A | Discharge status: Home / Self Care | Payer: Commercial Managed Care - HMO | Attending: Emergency Medicine | Admitting: Emergency Medicine

## 2021-09-23 DIAGNOSIS — L02224 Furuncle of groin: Secondary | ICD-10-CM | POA: Diagnosis not present

## 2021-09-23 MED ORDER — SULFAMETHOXAZOLE-TRIMETHOPRIM 800-160 MG PO TABS: 2.0000 | ORAL_TABLET | Freq: Two times a day (BID) | ORAL | 0 refills | Status: AC

## 2021-09-23 NOTE — ED Provider Notes (Signed)
[facility]    CSN: [id number] Arrival date & time: 09/23/21  0855    HISTORY   Chief Complaint  Patient presents with   Abscess   HPI Thomas Stanley is a pleasant, 26 y.o. male who presents to urgent care today. Patient complains of ingrown hair on the right side of his groin that began about a week ago.  Patient states the area became painful yesterday.  Patient states has been applying warm compresses with no relief of pain or resolution of the swelling around the ingrown hair..  The history is provided by the patient.   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 Authorizing Provider  naproxen (NAPROSYN) 500 MG tablet Take 1 tablet (500 mg total) by mouth 2 (two) times daily with a meal. 12/10/19   Wallis Bamberg, PA-C  tiZANidine (ZANAFLEX) 4 MG tablet Take 1 tablet (4 mg total) by mouth at bedtime. 12/10/19   Wallis Bamberg, PA-C  promethazine (PHENERGAN) 25 MG tablet Take 25 mg by mouth every 8 (eight) hours as needed for nausea or vomiting.  07/14/14 12/10/19  [provider]  ranitidine (ZANTAC) 150 MG tablet Take 1 tablet (150 mg total) by mouth 2 (two) times daily. Patient not taking: Reported on 07/22/2014 05/01/14 12/10/19  Sandford Craze, NP    Family History Family History  Problem Relation Age of Onset   Cancer Other        prostate   Diabetes Maternal Grandmother    Hypertension Maternal Grandmother    Arthritis Maternal Grandmother    Heart murmur Maternal Grandmother    Heart disease Neg Hx     Stroke Neg Hx    Social History Social History   Tobacco Use   Smoking status: Never   Smokeless tobacco: Never  Substance Use Topics   Alcohol use: No   Drug use: No   Allergies   Patient has no known allergies.  Review of Systems Review of Systems Pertinent findings revealed after performing a 14 point review of systems has been noted in the history of present illness.  Physical Exam Triage Vital Signs ED Triage Vitals  Enc Vitals Group     BP 12/04/20 0827 (!) 147/82     Pulse Rate 12/04/20 0827 72     Resp 12/04/20 0827 18     Temp 12/04/20 0827 98.3 F (36.8 C)     Temp Source 12/04/20 0827 Oral     SpO2 12/04/20 0827 98 %     Weight --      Height --      Head Circumference --      Peak Flow --      Pain Score 12/04/20 0826 5     Pain Loc --      Pain Edu? --      Excl. in GC? --   No data found.  Updated Vital Signs BP 119/70 (BP Location:  Right Arm)   Pulse (!) 52   Temp 97.9 F (36.6 C) (Oral)   Resp 18   SpO2 98%   Physical Exam Vitals and nursing note reviewed.  Constitutional:      General: He is not in acute distress.    Appearance: Normal appearance. He is normal weight. He is not ill-appearing.  HENT:     Head: Normocephalic and atraumatic.  Eyes:     Extraocular Movements: Extraocular movements intact.     Conjunctiva/sclera: Conjunctivae normal.     Pupils: Pupils are equal, round, and reactive to light.  Cardiovascular:     Rate and Rhythm: Normal rate and regular rhythm.  Pulmonary:     Effort: Pulmonary effort is normal.     Breath sounds: Normal breath sounds.  Musculoskeletal:        General: Normal range of motion.     Cervical back: Normal range of motion and neck supple.  Skin:    General: Skin is warm and dry.     Findings: Lesion (Folliculitis on the right scrotum medial to inguinal fold) present.  Neurological:     General: No focal deficit present.     Mental Status: He is alert and oriented to person, place, and  time. Mental status is at baseline.  Psychiatric:        Mood and Affect: Mood normal.        Behavior: Behavior normal.        Thought Content: Thought content normal.        Judgment: Judgment normal.     Visual Acuity Right Eye Distance:   Left Eye Distance:   Bilateral Distance:    Right Eye Near:   Left Eye Near:    Bilateral Near:     UC Couse / Diagnostics / Procedures:     Radiology No results found.  Procedures Procedures (including critical care time) EKG  Pending results:  Labs Reviewed - No data to display  Medications Ordered in UC: Medications - No data to display  UC Diagnoses / Final Clinical Impressions(s)   I have reviewed the triage vital signs and the nursing notes.  Pertinent labs & imaging results that were available during my care of the patient were reviewed by me and considered in my medical decision making (see chart for details).    Final diagnoses:  Furuncle of groin   Patient provided with conservative care instructions.  Patient provided with a prescription for Bactrim 2 tablets twice daily for 5 days.  Patient advised to finish the entire 5-day course to completely eradicate the infection.  Return precautions advised.  ED Prescriptions     Medication Sig Dispense Auth. Provider   sulfamethoxazole-trimethoprim (BACTRIM DS) 800-160 MG tablet Take 2 tablets by mouth 2 (two) times daily for 5 days. 20 tablet Theadora Rama Barreira, PA-C      PDMP not reviewed this encounter.  Pending results:  Labs Reviewed - No data to display  Discharge Instructions:   Discharge Instructions      Please begin Bactrim, TWO tablets TWICE daily for full 5 days.  This is an antibiotic that eliminates bacteria that gets into the hair follicles of our skin and causes infections.  As we discussed, it is a useful antibiotic for treating MRSA, methicillin-resistant Staphylococcus aureus.  You can continue to apply warm compress to the area to help  bring it "to a head" and encourage it to drain on his own.  I do not recommend attempting  to compress the area to "pop it", sometimes this drives the infection deeper into your skin and not out of your skin, like you are trying to do.  Keep in mind, while it is not necessary for the wound to open and drain, if you do not have complete relief of your swelling and pain after 5 days of antibiotics, please return for repeat evaluation.    Thank you for visiting urgent care today.  It was a pleasure to meet you today.  Please let us know there is nothing else we can do for you.      Disposition Upon Discharge:  Condition: stable for discharge home  Patient presented with an acute illness with associated systemic symptoms and significant discomfort requiring urgent management. In my opinion, this is a condition that a prudent lay person (someone who possesses an average knowledge of health and medicine) may potentially expect to result in complications if not addressed urgently such as respiratory distress, impairment of bodily function or dysfunction of bodily organs.   Routine symptom specific, illness specific and/or disease specific instructions were discussed with the patient and/or caregiver at length.   As such, the patient has been evaluated and assessed, work-up was performed and treatment was provided in alignment with urgent care protocols and evidence based medicine.  Patient/parent/caregiver has been advised that the patient may require follow up for further testing and treatment if the symptoms continue in spite of treatment, as clinically indicated and appropriate.  Patient/parent/caregiver has been advised to return to the [facility] or PCP if no better; to PCP or the Emergency Department if new signs and symptoms develop, or if the current signs or symptoms continue to change or worsen for further workup, evaluation and treatment as clinically indicated and appropriate  The patient will  follow up with their current PCP if and as advised. If the patient does not currently have a PCP we will assist them in obtaining one.   The patient may need specialty follow up if the symptoms continue, in spite of conservative treatment and management, for further workup, evaluation, consultation and treatment as clinically indicated and appropriate.   Patient/parent/caregiver verbalized understanding and agreement of plan as discussed.  All questions were addressed during visit.  Please see discharge instructions below for further details of plan.  This office note has been dictated using Teaching laboratory technician.  Unfortunately, this method of dictation can sometimes lead to typographical or grammatical errors.  I apologize for your inconvenience in advance if this occurs.  Please do not hesitate to reach out to me if clarification is needed.      Theadora Rama [place], [place] 09/23/21 [phone]

## 2021-09-23 NOTE — ED Triage Notes (Signed)
The pt c/o ingrown hair to the right groin that began about a weeks ago. The patient states there is irrigation to the area.

## 2021-09-23 NOTE — Discharge Instructions (Addendum)
Please begin Bactrim, TWO tablets TWICE daily for full 5 days.  This is an antibiotic that eliminates bacteria that gets into the hair follicles of our skin and causes infections.  As we discussed, it is a useful antibiotic for treating MRSA, methicillin-resistant Staphylococcus aureus.  You can continue to apply warm compress to the area to help bring it "to a head" and encourage it to drain on his own.  I do not recommend attempting to compress the area to "pop it", sometimes this drives the infection deeper into your skin and not out of your skin, like you are trying to do.  Keep in mind, while it is not necessary for the wound to open and drain, if you do not have complete relief of your swelling and pain after 5 days of antibiotics, please return for repeat evaluation.    Thank you for visiting urgent care today.  It was a pleasure to meet you today.  Please let us know there is nothing else we can do for you.

## 2022-01-24 ENCOUNTER — Ambulatory Visit
Admission: EM | Admit: 2022-01-24 | Discharge: 2022-01-24 | Disposition: A | Discharge status: Home / Self Care | Payer: Commercial Managed Care - HMO | Attending: Urgent Care | Admitting: Urgent Care

## 2022-01-24 DIAGNOSIS — R109 Unspecified abdominal pain: Secondary | ICD-10-CM | POA: Insufficient documentation

## 2022-01-24 DIAGNOSIS — G8929 Other chronic pain: Secondary | ICD-10-CM | POA: Insufficient documentation

## 2022-01-24 DIAGNOSIS — E860 Dehydration: Secondary | ICD-10-CM | POA: Diagnosis not present

## 2022-01-24 DIAGNOSIS — S39012A Strain of muscle, fascia and tendon of lower back, initial encounter: Secondary | ICD-10-CM | POA: Diagnosis not present

## 2022-01-24 DIAGNOSIS — Z87442 Personal history of urinary calculi: Secondary | ICD-10-CM | POA: Diagnosis not present

## 2022-01-24 DIAGNOSIS — Z113 Encounter for screening for infections with a predominantly sexual mode of transmission: Secondary | ICD-10-CM | POA: Diagnosis present

## 2022-01-24 DIAGNOSIS — M545 Low back pain, unspecified: Secondary | ICD-10-CM

## 2022-01-24 LAB — POCT URINALYSIS DIP (MANUAL ENTRY)
Bilirubin, UA: NEGATIVE
Blood, UA: NEGATIVE
Glucose, UA: NEGATIVE mg/dL
Leukocytes, UA: NEGATIVE
Nitrite, UA: NEGATIVE
Protein Ur, POC: 100 mg/dL — AB
Spec Grav, UA: 1.025 (ref 1.010–1.025)
Urobilinogen, UA: 1 E.U./dL
pH, UA: 7 (ref 5.0–8.0)

## 2022-01-24 MED ORDER — TIZANIDINE HCL 4 MG PO TABS: 4.0000 mg | ORAL_TABLET | Freq: Every day | ORAL | 0 refills | Status: DC

## 2022-01-24 MED ORDER — NAPROXEN 500 MG PO TABS: 500.0000 mg | ORAL_TABLET | Freq: Two times a day (BID) | ORAL | 0 refills | Status: DC
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
## 2022-01-25 ENCOUNTER — Telehealth: Payer: Self-pay | Admitting: Urgent Care

## 2022-01-25 LAB — CYTOLOGY, (ORAL, ANAL, URETHRAL) ANCILLARY ONLY
Chlamydia: NEGATIVE
Comment: NEGATIVE
Comment: NEGATIVE
Comment: NORMAL
Neisseria Gonorrhea: NEGATIVE
Trichomonas: NEGATIVE

## 2022-01-25 LAB — HIV ANTIBODY (ROUTINE TESTING W REFLEX): HIV Screen 4th Generation wRfx: NONREACTIVE

## 2022-01-25 LAB — RPR: RPR Ser Ql: NONREACTIVE

## 2022-01-25 NOTE — Telephone Encounter (Signed)
Patient called and requested a note for specific dates.  I will provide this.  We are not able to do short-term disability or FMLA.  He is to follow-up with either occupational health or his new PCP.

## 2022-12-23 DIAGNOSIS — H5213 Myopia, bilateral: Secondary | ICD-10-CM | POA: Diagnosis not present

## 2023-02-09 ENCOUNTER — Ambulatory Visit (HOSPITAL_COMMUNITY): Payer: Medicaid Other

## 2023-02-09 ENCOUNTER — Ambulatory Visit (HOSPITAL_COMMUNITY)
Admission: EM | Admit: 2023-02-09 | Discharge: 2023-02-09 | Disposition: A | Discharge status: Home / Self Care | Payer: Medicaid Other | Attending: Emergency Medicine | Admitting: Emergency Medicine

## 2023-02-09 ENCOUNTER — Encounter (HOSPITAL_COMMUNITY): Payer: Self-pay

## 2023-02-09 DIAGNOSIS — M25512 Pain in left shoulder: Secondary | ICD-10-CM | POA: Diagnosis not present

## 2023-02-09 DIAGNOSIS — H5213 Myopia, bilateral: Secondary | ICD-10-CM | POA: Diagnosis not present

## 2023-02-09 DIAGNOSIS — R079 Chest pain, unspecified: Secondary | ICD-10-CM

## 2023-02-09 DIAGNOSIS — M542 Cervicalgia: Secondary | ICD-10-CM

## 2023-02-09 MED ORDER — IBUPROFEN 800 MG PO TABS: 800.0000 mg | ORAL_TABLET | Freq: Three times a day (TID) | ORAL | 0 refills | Status: AC

## 2023-02-09 MED ORDER — CYCLOBENZAPRINE HCL 10 MG PO TABS: 10.0000 mg | ORAL_TABLET | Freq: Two times a day (BID) | ORAL | 0 refills | Status: AC | PRN

## 2023-02-09 NOTE — Discharge Instructions (Addendum)
 Your xray looks good. I do not see any abnormality of the shoulder bones, collar bone, ribs, heart or lungs. The radiologist will give us  the final report tomorrow. Staff will call you if there is any abnormality seen.   For now I would like you to try anti-inflammatory and muscle relaxer You can take the Flexeril  twice daily. If the medication makes you drowsy, take only at bed time. Ibuprofen  can be used every 6 hours, with food.  Use for the next 5-6 days consistently Also try hot pad, ice and gentle stretching Avoid heavy lifting incase this is a muscular injury  If you feel symptoms worsen, please go to the emergency department. Otherwise I recommend to follow up with a primary care provider. You can scan the QR code on the last page to get established with a new one

## 2023-02-09 NOTE — ED Triage Notes (Signed)
 Pt c/o lt shoulder/collar bone pain, neck sore ness, chest tightness, and hard to breath x5 days. Denies taking any meds. States works out 4-5 days a week.

## 2023-02-09 NOTE — ED Provider Notes (Signed)
 [facility]    CSN: [id number] Arrival date & time: 02/09/23  1644      History   Chief Complaint Chief Complaint  Patient presents with   Shoulder Pain   Chest Pain    HPI Thomas Stanley is a 28 y.o. male.  5 day history of left neck into left chest pain At first complained of shoulder pain. However denies having pain in the shoulder or with motion. He is most concerned about the symptoms into the chest and wonders if it's his heart He feels area is worse with deep breath Not short of breath or wheezing. 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 Authorizing Provider  cyclobenzaprine  (FLEXERIL ) 10 MG tablet Take 1 tablet (10 mg total) by mouth 2 (two) times daily as needed for muscle spasms. 02/09/23  Yes Babette Stum, [place], PA-C  ibuprofen  (ADVIL ) 800 MG tablet Take 1 tablet (800 mg total) by mouth 3 (three) times daily. 02/09/23  Yes Jamaris Biernat, [place], PA-C  promethazine (PHENERGAN) 25 MG tablet Take 25 mg by mouth every 8 (eight) hours as needed for nausea or vomiting.  07/14/14 12/10/19  [provider]  ranitidine  (ZANTAC ) 150 MG tablet Take 1 tablet (150 mg total) by mouth 2 (two) times daily. Patient not taking: Reported on 07/22/2014 05/01/14 12/10/19  Daryl Setter, NP    Family History Family History  Problem Relation Age of  Onset   Cancer Other        prostate   Diabetes Maternal Grandmother    Hypertension Maternal Grandmother    Arthritis Maternal Grandmother    Heart murmur Maternal Grandmother    Heart disease Neg Hx    Stroke Neg Hx     Social History Social History   Tobacco Use   Smoking status: Never   Smokeless tobacco: Never  Vaping Use   Vaping status: Never Used  Substance Use Topics   Alcohol use: No   Drug use: No     Allergies   Patient has no known allergies.   Review of Systems Review of Systems Per HPI  Physical Exam Triage Vital Signs ED Triage Vitals  Encounter Vitals Group     BP 02/09/23 1908 136/70     Systolic BP Percentile --      Diastolic BP Percentile --      Pulse Rate 02/09/23 1908 (!) 54     Resp 02/09/23 1908 18     Temp 02/09/23 1908 98.4 F (36.9 C)     Temp Source 02/09/23 1908 Oral     SpO2 02/09/23 1908 97 %     Weight --      Height --      Head Circumference --  Peak Flow --      Pain Score 02/09/23 1909 5     Pain Loc --      Pain Education --      Exclude from Growth Chart --    No data found.  Updated Vital Signs BP 136/70 (BP Location: Left Arm)   Pulse (!) 54   Temp 98.4 F (36.9 C) (Oral)   Resp 18   SpO2 97%    Physical Exam Vitals and nursing note reviewed.  Constitutional:      General: He is not in acute distress. HENT:     Mouth/Throat:     Pharynx: Oropharynx is clear.  Neck:     Vascular: No carotid bruit.  Cardiovascular:     Rate and Rhythm: Normal rate and regular rhythm.     Pulses: Normal pulses.          Radial pulses are 2+ on the right side and 2+ on the left side.     Heart sounds: Normal heart sounds.     Comments: Pulses equal Pulmonary:     Effort: Pulmonary effort is normal.     Breath sounds: Normal breath sounds.     Comments: Lungs are clear, air movement auscultated throughout  Chest:       Comments: Points to left upper chest and into left neck. Not tender to palpation. No  bony tenderness of clavicle, ribs, AC joint, scapula. No bruising or rash. No mass or abscess is palpated  Musculoskeletal:        General: Normal range of motion.     Cervical back: Normal range of motion. No rigidity or tenderness.     Comments: Full ROM of left upper extremity without pain. Strength 5/5, sensation intact. Negative hawkins and apley scratch test  Lymphadenopathy:     Cervical: No cervical adenopathy.  Skin:    General: Skin is warm and dry.     Capillary Refill: Capillary refill takes less than 2 seconds.  Neurological:     Mental Status: He is alert and oriented to person, place, and time.     UC Treatments / Results  Labs (all labs ordered are listed, but only abnormal results are displayed) Labs Reviewed - No data to display  EKG  Radiology No results found.  Procedures Procedures   Medications Ordered in UC Medications - No data to display  Initial Impression / Assessment and Plan / UC Course  I have reviewed the triage vital signs and the nursing notes.  Pertinent labs & imaging results that were available during my care of the patient were reviewed by me and considered in my medical decision making (see chart for details).  Normal exam. Lungs are clear. Heart RRR. Offered chest xray to rule out bony abnormality, pneumothorax, abnormal cardiac silhouette etc Xray is unremarkable on preliminary read. Awaiting radiology final read. Will notify patient next day of any abnormal result.  Discussed several possible etiologies. Reassurance provided. I have low concern for cardiac abnormality, especially with patient age, no risk factors or cardiac history.  For now I recommend to treat as musculoskeletal, advised ibuprofen  and flexeril , rest, ice, heat, etc Recommend set up with PCP as soon as able for follow up Discussed reasons to be evaluated in the emergency department  Final Clinical Impressions(s) / UC Diagnoses   Final diagnoses:  Left-sided  chest pain  Neck pain on left side     Discharge Instructions      Your xray looks good.  I do not see any abnormality of the shoulder bones, collar bone, ribs, heart or lungs. The radiologist will give us  the final report tomorrow. Staff will call you if there is any abnormality seen.   For now I would like you to try anti-inflammatory and muscle relaxer You can take the Flexeril  twice daily. If the medication makes you drowsy, take only at bed time. Ibuprofen  can be used every 6 hours, with food.  Use for the next 5-6 days consistently Also try hot pad, ice and gentle stretching Avoid heavy lifting incase this is a muscular injury  If you feel symptoms worsen, please go to the emergency department. Otherwise I recommend to follow up with a primary care provider. You can scan the QR code on the last page to get established with a new one     ED Prescriptions     Medication Sig Dispense Auth. Provider   cyclobenzaprine  (FLEXERIL ) 10 MG tablet Take 1 tablet (10 mg total) by mouth 2 (two) times daily as needed for muscle spasms. 20 tablet Uldine Fuster, PA-C   ibuprofen  (ADVIL ) 800 MG tablet Take 1 tablet (800 mg total) by mouth 3 (three) times daily. 21 tablet Jakara Blatter, [place], PA-C      PDMP not reviewed this encounter.   Odyn Turko, [place] RIGGERS 02/09/23 2054

## 2023-02-10 ENCOUNTER — Ambulatory Visit (HOSPITAL_COMMUNITY): Payer: Self-pay

## 2023-02-11 ENCOUNTER — Other Ambulatory Visit: Payer: Self-pay

## 2023-02-11 ENCOUNTER — Emergency Department (HOSPITAL_COMMUNITY)
Admission: EM | Admit: 2023-02-11 | Discharge: 2023-02-11 | Disposition: A | Discharge status: Home / Self Care | Payer: Medicaid Other | Attending: Emergency Medicine | Admitting: Emergency Medicine

## 2023-02-11 ENCOUNTER — Encounter (HOSPITAL_COMMUNITY): Payer: Self-pay

## 2023-02-11 ENCOUNTER — Emergency Department (HOSPITAL_COMMUNITY): Payer: Medicaid Other

## 2023-02-11 DIAGNOSIS — R001 Bradycardia, unspecified: Secondary | ICD-10-CM | POA: Diagnosis not present

## 2023-02-11 DIAGNOSIS — R0789 Other chest pain: Secondary | ICD-10-CM | POA: Diagnosis not present

## 2023-02-11 DIAGNOSIS — R079 Chest pain, unspecified: Secondary | ICD-10-CM | POA: Diagnosis not present

## 2023-02-11 DIAGNOSIS — R0602 Shortness of breath: Secondary | ICD-10-CM | POA: Insufficient documentation

## 2023-02-11 LAB — CK: Total CK: 139 U/L (ref 49–397)

## 2023-02-11 LAB — BASIC METABOLIC PANEL
Anion gap: 6 (ref 5–15)
BUN: 14 mg/dL (ref 6–20)
CO2: 26 mmol/L (ref 22–32)
Calcium: 9.6 mg/dL (ref 8.9–10.3)
Chloride: 105 mmol/L (ref 98–111)
Creatinine, Ser: 0.88 mg/dL (ref 0.61–1.24)
GFR, Estimated: >60 mL/min (ref >60)
Glucose, Bld: 87 mg/dL (ref 70–99)
Potassium: 4.2 mmol/L (ref 3.5–5.1)
Sodium: 137 mmol/L (ref 135–145)

## 2023-02-11 LAB — CBC
HCT: 40.5 % (ref 39.0–52.0)
Hemoglobin: 13.9 g/dL (ref 13.0–17.0)
MCH: 32.6 pg (ref 26.0–34.0)
MCHC: 34.3 g/dL (ref 30.0–36.0)
MCV: 94.8 fL (ref 80.0–100.0)
Platelets: 238 10*3/uL (ref 150–400)
RBC: 4.27 MIL/uL (ref 4.22–5.81)
RDW: 12.9 % (ref 11.5–15.5)
WBC: 4.6 10*3/uL (ref 4.0–10.5)
nRBC: 0 % (ref 0.0–0.2)

## 2023-02-11 LAB — TROPONIN I (HIGH SENSITIVITY)
Troponin I (High Sensitivity): 7 ng/L (ref <18)
Troponin I (High Sensitivity): 7 ng/L (ref <18)

## 2023-02-11 NOTE — ED Provider Notes (Signed)
 [facility] AT [facility] Provider Note   CSN: [id number] Arrival date & time: 02/11/23  1344     History  Chief Complaint  Patient presents with   Chest Pain    Thomas Stanley is a 28 y.o. male no pertinent past medical history presented with chest pain shortness of breath that has been present for the past week.  Patient states he does work out often and states that the chest pain is on his left side does radiate up to his left trapezius muscle.  Patient's mood has been worse.  Patient was seen at the urgent care and diagnosed with MSK related chest pain however has not taken any anti-inflammatories or muscle relaxers yet as he likes to take holistic medications including vitamins for his pain.  Patient denies any recent travel/hospitalization/surgery, personal history of cancer, leg swelling, previous history of blood clots, estrogen use, hemoptysis.  Patient denies cardiac history.  Home Medications Prior to Admission medications   Medication Sig Start Date End Date Taking? Authorizing Provider  cyclobenzaprine  (FLEXERIL ) 10 MG tablet Take 1 tablet (10 mg total) by mouth 2 (two) times daily as needed for muscle spasms. 02/09/23   Rising, Asberry, PA-C  ibuprofen  (ADVIL ) 800 MG tablet Take 1 tablet (800 mg total) by mouth 3 (three) times daily. 02/09/23   Rising, Asberry, PA-C  promethazine (PHENERGAN) 25 MG tablet Take 25 mg by mouth every 8 (eight) hours as needed for nausea or vomiting.  07/14/14 12/10/19  [provider]  ranitidine  (ZANTAC ) 150 MG tablet Take 1 tablet (150 mg total) by mouth 2 (two) times daily. Patient not taking: Reported on 07/22/2014 05/01/14 12/10/19  O'Sullivan, Melissa, NP      Allergies    Patient has no known allergies.    Review of Systems   Review of Systems  Cardiovascular:  Positive for chest pain.    Physical Exam Updated Vital Signs BP 135/69   Pulse 61   Temp 98.4 F (36.9 C) (Oral)   Resp 16   Ht 6'  (1.829 m)   Wt 99.8 kg   SpO2 100%   BMI 29.84 kg/m  Physical Exam Vitals reviewed.  Constitutional:      General: He is not in acute distress. HENT:     Head: Normocephalic and atraumatic.  Eyes:     Extraocular Movements: Extraocular movements intact.     Conjunctiva/sclera: Conjunctivae normal.     Pupils: Pupils are equal, round, and reactive to light.  Cardiovascular:     Rate and Rhythm: Regular rhythm. Bradycardia present.     Pulses: Normal pulses.     Heart sounds: Normal heart sounds.     Comments: 2+ bilateral radial/dorsalis pedis pulses with bradycardic rate Pulmonary:     Effort: Pulmonary effort is normal. No respiratory distress.     Breath sounds: Normal breath sounds.  Chest:     Chest wall: Tenderness (Left side going up to the left trapezius muscle) present.  Abdominal:     Palpations: Abdomen is soft.     Tenderness: There is no abdominal tenderness. There is no guarding or rebound.  Musculoskeletal:        General: Normal range of motion.     Cervical back: Normal range of motion and neck supple.     Comments: 5 out of 5 bilateral grip/leg extension strength  Skin:    General: Skin is warm and dry.     Capillary Refill: Capillary refill takes less  than 2 seconds.  Neurological:     General: No focal deficit present.     Mental Status: He is alert and oriented to person, place, and time.     Comments: Sensation intact in all 4 limbs  Psychiatric:        Mood and Affect: Mood normal.     ED Results / Procedures / Treatments   Labs (all labs ordered are listed, but only abnormal results are displayed) Labs Reviewed  BASIC METABOLIC PANEL  CBC  CK  TROPONIN I (HIGH SENSITIVITY)  TROPONIN I (HIGH SENSITIVITY)    EKG None  Radiology DG Chest 2 View Result Date: 02/11/2023 CLINICAL DATA:  Chest pain EXAM: CHEST - 2 VIEW COMPARISON:  04/14/2006 FINDINGS: The heart size and mediastinal contours are within normal limits. Both lungs are clear. The  visualized skeletal structures are unremarkable. IMPRESSION: No acute abnormality of the lungs. Electronically Signed   By: Marolyn JONETTA Jaksch M.D.   On: 02/11/2023 14:27   DG Clavicle Left Result Date: 02/09/2023 CLINICAL DATA:  Collarbone pain EXAM: LEFT CLAVICLE - 2+ VIEWS COMPARISON:  None Available. FINDINGS: There is no evidence of fracture or other focal bone lesions. Soft tissues are unremarkable. IMPRESSION: Negative. Electronically Signed   By: Luke Bun M.D.   On: 02/09/2023 21:51    Procedures Procedures    Medications Ordered in ED Medications - No data to display  ED Course/ Medical Decision Making/ A&P                                 Medical Decision Making Amount and/or Complexity of Data Reviewed Labs: ordered. Radiology: ordered.   Thomas Stanley 28 y.o. presented today for chest pain. Working DDx that I considered at this time includes, but not limited to, ACS, pulmonary embolism, community-acquired pneumonia, aortic dissection, pneumothorax, underlying bony abnormality, anemia, thyrotoxicosis, HTN urgency/emergency, esophageal rupture, CHF exacerbation, valvular disorder, myocarditis, pericarditis, endocarditis, pericardial effusion/cardiac tamponade, pulmonary edema, gastritis/PUD/GERD, esophagitis, MSK.  R/o Dx: ACS, pulmonary embolism, community-acquired pneumonia, aortic dissection, pneumothorax, underlying bony abnormality, anemia, thyrotoxicosis, HTN urgency/emergency, esophageal rupture, CHF exacerbation, valvular disorder, myocarditis, pericarditis, endocarditis, pericardial effusion/cardiac tamponade, pulmonary edema, gastritis/PUD/GERD, esophagiti: These are considered less likely due to history of present illness and physical exam findings. Aortic Dissection: less likely based on the location, quality, onset, and severity of symptoms in this case. Patient also has a lack of underlying history of AD or TAA.   Review of prior external notes: 02/09/2023  ED  Unique Tests and My Interpretation:  EKG: Sinus bradycardia 44 bpm, no blocks or ST elevations or depressions noted Troponin: 7, 7 CXR: No acute findings CBC: Unremarkable BMP: Unremarkable CK: Unremarkable  Social Determinants of Health: none  Discussion with Independent Historian:  Mother  Discussion of Management of Tests: None  Risk: Low: based on diagnostic testing/clinical impression and treatment plan  Risk Stratification Score: HEART: 0 PERC 0  Plan: On exam patient was in no acute distress but was noted to be bradycardic at 39 when I entered the room.  Patient does have baseline of high 40s low 50s 40s heart rate.  Patient denies cardiac history and I was able to reproduce his chest pain with palpation on exam so do feel this is MSK in nature.  Patient's labs in triage were negative including delta troponin.  Encouraged patient to use Tylenol  ibuprofen  every 6 hours as needed for pain and to use  the muscle relaxers at night before bed and do not operate machinery or drive after taking these as it will make him drowsy which patient verbalized understanding acceptance of.  Due to patient's bradycardia in the room we will give him cardiology referral as well for further evaluation however patient was able to ambulate around the room without any symptoms and states that he feels comfortable being discharged and following up in the outpatient setting.  Patient states he does work at 6 days a week and so do feel patient has bradycardia due to being well-conditioned as his heart rate did come up to 47 after ambulating and lasting.  Patient was given return precautions. Patient stable for discharge at this time.  Patient verbalized understanding of plan.  This chart was dictated using voice recognition software.  Despite best efforts to proofread,  errors can occur which can change the documentation meaning.         Final Clinical Impression(s) / ED Diagnoses Final  diagnoses:  Chest wall pain  Bradycardia    Rx / DC Orders ED Discharge Orders          Ordered    Ambulatory referral to Cardiology       Comments: If you have not heard from the Cardiology office within the next 72 hours please call [phone].   02/11/23 1900              Victor Lynwood ONEIDA DEVONNA 02/11/23 1917    Garrick Charleston, MD 02/11/23 2210

## 2023-02-11 NOTE — ED Provider Triage Note (Signed)
 Emergency Medicine Provider Triage Evaluation Note  Thomas Stanley , a 28 y.o. male  was evaluated in triage.  Pt complains of chest pain.  Review of Systems  Positive: Persistent chest pain, SOB Negative: Vomiting, fever  Physical Exam  BP (!) 143/68 (BP Location: Right Arm)   Pulse (!) 47   Temp 98.6 F (37 C) (Oral)   Resp 16   Ht 6' (1.829 m)   Wt 99.8 kg   SpO2 100%   BMI 29.84 kg/m  Gen:   Awake, no distress   Resp:  Normal effort  MSK:   Moves extremities without difficulty  Other:    Medical Decision Making  Medically screening exam initiated at 2:13 PM.  Appropriate orders placed.  Lavon J Katen was informed that the remainder of the evaluation will be completed by another provider, this initial triage assessment does not replace that evaluation, and the importance of remaining in the ED until their evaluation is complete.  Seen 1/2 for similar at Urgent Care - came here for persistent/worsening symptoms of left chest pain into shoulder. No modifying factors. No fever, cough. Works out regularly. No nausea.    Odell Balls, PA-C 02/11/23 1415

## 2023-02-11 NOTE — ED Triage Notes (Signed)
 Pt arrives via POV. Pt reports over the past two days he has been experiencing chest tightness and sob. He reports he initially thought he could have injured himself while working out about 6 days ago. Pt is AxOx4.

## 2023-02-11 NOTE — Discharge Instructions (Addendum)
 Please follow-up with your primary care provider of attached you for you today.  Today your labs and urine were reassuring however your heart rate did appear to be in the low 40s today and so we will refer you to a cardiologist for that as well.  Please take Tylenol  ibuprofen  for chest pain and with your muscle laxer's please take this at night and do not operate machinery or drive as this could make you drowsy.  If symptoms change or worsen please return to the ER.

## 2023-02-16 ENCOUNTER — Encounter: Payer: Self-pay | Admitting: Internal Medicine

## 2023-02-16 ENCOUNTER — Ambulatory Visit: Payer: Medicaid Other | Attending: Internal Medicine | Admitting: Internal Medicine

## 2023-02-16 VITALS — BP 110/64 | HR 49 | Ht 72.0 in | Wt 223.8 lb

## 2023-02-16 DIAGNOSIS — R001 Bradycardia, unspecified: Secondary | ICD-10-CM | POA: Diagnosis not present

## 2023-02-16 DIAGNOSIS — R072 Precordial pain: Secondary | ICD-10-CM | POA: Diagnosis not present

## 2023-02-16 DIAGNOSIS — I517 Cardiomegaly: Secondary | ICD-10-CM | POA: Diagnosis not present

## 2023-02-16 NOTE — Progress Notes (Signed)
 Cardiology Office Note:   Date:  02/16/2023  ID:  Thomas Stanley, DOB 04-Oct-1995, MRN [id number] PCP:  Freddrick, No  [facility] Providers Cardiologist:  Wendel Haws, MD Referring MD: Thomas Stanley  Chief Complaint/Reason for Referral:  Bradycardia ASSESSMENT:    1. Sinus bradycardia by electrocardiogram   2. Left ventricular hypertrophy by electrocardiogram   3. Precordial pain     PLAN:   In order of problems listed above: Sinus bradycardia: Heart rate today is 49.  In this athletic young person I do not think this heart rate is concerning. Left ventricular hypertrophy on EKG: Will obtain echocardiogram to evaluate further. Chest pain: This seems to be musculoskeletal in nature.  I have asked the patient to take his ibuprofen  3 times a day on a daily basis rather than on a as needed basis.  Of asked him to contact us  in a few weeks if his chest pain does not improve.             Dispo:  Return in about 1 year (around 02/16/2024).      Medication Adjustments/Labs and Tests Ordered: Current medicines are reviewed at length with the patient today.  Concerns regarding medicines are outlined above.  The following changes have been made:  no change   Labs/tests ordered: Orders Placed This Encounter  Procedures   ECHOCARDIOGRAM COMPLETE    Medication Changes: No orders of the defined types were placed in this encounter.   Current medicines are reviewed at length with the patient today.  The patient does not have concerns regarding medicines.    History of Present Illness:      FOCUSED PROBLEM LIST:   BMI 29   1/25: The patient is a 28 year old male who presented to emergency department with complaints of chest pain.  The patient exercises a lot and this was diagnosed as musculoskeletal chest pain with tenderness to palpation along his left trapezius muscle.  An EKG was done which demonstrated sinus bradycardia and left ventricular hypertrophy.  His other  evaluation including cardiac biomarkers were negative.  He is referred for further recommendations regarding incidental bradycardia.  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  He exercises routinely jumping rope and lifting weights.  He was doing flies sometime ago and after that he noticed left shoulder pain which radiated to his left chest.  When he takes a deep breath the chest discomfort will be exacerbated.  He also notices that sometimes when he walks upstairs.  Prior to this exercise induced injury he was without chest wall symptoms.  He was prescribed Flexeril  and ibuprofen .  He has not been taking either on a regular basis.  He does use CBD and loose leaf tobacco.  He does not use any other illicit substances.  He is otherwise well and without significant complaints today.         Current Medications: Current Meds  Medication Sig   cyclobenzaprine  (FLEXERIL ) 10 MG tablet Take 1 tablet (10 mg total) by mouth 2 (two) times daily as needed for muscle spasms.   ibuprofen  (ADVIL ) 800 MG tablet Take 1 tablet (800 mg total) by mouth 3 (three) times daily.     Review of Systems:   Please see the history of present illness.    All other systems reviewed and are negative.     EKGs/Labs/Other Test Reviewed:   EKG: EKG from  January 2025 demonstrates sinus bradycardia and left ventricular hypertrophy  EKG Interpretation Date/Time:    Ventricular Rate:    PR Interval:    QRS Duration:    QT Interval:    QTC Calculation:   R Axis:      Text Interpretation:           Risk Assessment/Calculations:          Physical Exam:   VS:  BP 110/64   Pulse (!) 49   Ht 6' (1.829 m)   Wt 223 lb 12.8 oz (101.5 kg)   SpO2 99%   BMI 30.35 kg/m        Wt Readings from Last 3 Encounters:  02/16/23 223 lb 12.8 oz (101.5 kg)  02/11/23 220 lb (99.8 kg)  06/17/15 185 lb (83.9 kg)      GENERAL:  No  apparent distress, AOx3 HEENT:  No carotid bruits, +2 carotid impulses, no scleral icterus CAR: RRR no murmurs, gallops, rubs, or thrills RES:  Clear to auscultation bilaterally ABD:  Soft, nontender, nondistended, positive bowel sounds x 4 VASC:  +2 radial pulses, +2 carotid pulses NEURO:  CN 2-12 grossly intact; motor and sensory grossly intact PSYCH:  No active depression or anxiety EXT:  No edema, ecchymosis, or cyanosis  Signed, Marlenne Ridge K Ranjit Ashurst, MD  02/16/2023 10:26 AM    [facility] [address] [place], [place], [place]  [address] Phone: [phone]; Fax: [phone]   Note:  This document was prepared using Dragon voice recognition software and may include unintentional dictation errors.

## 2023-02-16 NOTE — Patient Instructions (Addendum)
 Medication Instructions:  No changes *If you need a refill on your cardiac medications before your next appointment, please call your pharmacy*   Lab Work: none   Testing/Procedures: Your physician has requested that you have an echocardiogram. Echocardiography is a painless test that uses sound waves to create images of your heart. It provides your doctor with information about the size and shape of your heart and how well your heart's chambers and valves are working. This procedure takes approximately one hour. There are no restrictions for this procedure. Please do NOT wear cologne, perfume, aftershave, or lotions (deodorant is allowed). Please arrive 15 minutes prior to your appointment time.  Please note: We ask at that you not bring children with you during ultrasound (echo/ vascular) testing. Due to room size and safety concerns, children are not allowed in the ultrasound rooms during exams. Our front office staff cannot provide observation of children in our lobby area while testing is being conducted. An adult accompanying a patient to their appointment will only be allowed in the ultrasound room at the discretion of the ultrasound technician under special circumstances. We apologize for any inconvenience.   Follow-Up: At [facility], you and your health needs are our priority.  As part of our continuing mission to provide you with exceptional heart care, we have created designated Provider Care Teams.  These Care Teams include your primary Cardiologist (physician) and Advanced Practice Providers (APPs -  Physician Assistants and Nurse Practitioners) who all work together to provide you with the care you need, when you need it.   Your next appointment:   6 month(s)  Provider:   Arun Thukkani, MD

## 2023-02-28 ENCOUNTER — Ambulatory Visit: Payer: Self-pay | Admitting: Internal Medicine

## 2023-02-28 ENCOUNTER — Encounter: Payer: Self-pay | Admitting: Internal Medicine

## 2023-03-09 ENCOUNTER — Ambulatory Visit (HOSPITAL_COMMUNITY): Payer: Medicaid Other | Attending: Cardiology

## 2023-03-09 ENCOUNTER — Encounter: Payer: Self-pay | Admitting: Internal Medicine

## 2023-03-09 DIAGNOSIS — I517 Cardiomegaly: Secondary | ICD-10-CM | POA: Insufficient documentation

## 2023-03-09 LAB — ECHOCARDIOGRAM COMPLETE
Area-P 1/2: 3.34 cm2
S' Lateral: 3.1 cm

## 2023-03-29 ENCOUNTER — Ambulatory Visit: Payer: Medicaid Other | Admitting: Cardiology

## 2023-04-17 ENCOUNTER — Ambulatory Visit: Payer: Medicaid Other | Admitting: Cardiovascular Disease

## 2023-07-27 NOTE — Progress Notes (Deleted)
 Cardiology Office Note:   Date:  07/27/2023  ID:  Thomas Stanley, DOB 16-Nov-1995, MRN [id number] PCP:  Vicente Graham, No  [facility] Providers Cardiologist:  Alyssa Backbone, MD Referring MD: Suri Tafolla K, MD  Chief Complaint/Reason for Referral:  Bradycardia follow-up ASSESSMENT:    1. Sinus bradycardia by electrocardiogram   2. Left ventricular hypertrophy by electrocardiogram   3. Precordial pain      PLAN:   In order of problems listed above: Sinus bradycardia:  Left ventricular hypertrophy on EKG: Echocardiogram was reassuring.   Chest pain: ***            Dispo:  No follow-ups on file.      Medication Adjustments/Labs and Tests Ordered: Current medicines are reviewed at length with the patient today.  Concerns regarding medicines are outlined above.  The following changes have been made:  no change   Labs/tests ordered: No orders of the defined types were placed in this encounter.   Medication Changes: No orders of the defined types were placed in this encounter.   Current medicines are reviewed at length with the patient today.  The patient does not have concerns regarding medicines.    History of Present Illness:      FOCUSED PROBLEM LIST:   BMI 08 March 2023:  The patient is a 28 year old male who presented to emergency department with complaints of chest pain.  The patient exercises a lot and this was diagnosed as musculoskeletal chest pain with tenderness to palpation along his left trapezius muscle.  An EKG was done which demonstrated sinus bradycardia and left ventricular hypertrophy.  His other evaluation including cardiac biomarkers were negative.  He is referred for further recommendations regarding incidental bradycardia.  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  He exercises routinely jumping rope and lifting weights.  He was doing flies sometime ago and  after that he noticed left shoulder pain which radiated to his left chest.  When he takes a deep breath the chest discomfort will be exacerbated.  He also notices that sometimes when he walks upstairs.  Prior to this exercise induced injury he was without chest wall symptoms.  He was prescribed Flexeril  and ibuprofen .  He has not been taking either on a regular basis.  He does use CBD and loose leaf tobacco.  He does not use any other illicit substances.  He is otherwise well and without significant complaints today.  Plan:  Obtan TTE.  June 2026:  Patient consents to use of AI scribe. Patient returns for routine follow-up.  His echocardiogram was reassuring with a normal ejection fraction and no significant valvular abnormalities and no left ventricular hypertrophy.         Current Medications: No outpatient medications have been marked as taking for the 07/31/23 encounter (Appointment) with Allexus Ovens K, MD.     Review of Systems:   Please see the history of present illness.    All other systems reviewed and are negative.     EKGs/Labs/Other Test Reviewed:   EKG: EKG from January 2025 demonstrates sinus bradycardia and left ventricular hypertrophy  EKG Interpretation Date/Time:    Ventricular Rate:    PR Interval:    QRS Duration:    QT Interval:    QTC Calculation:   R Axis:      Text Interpretation:  Risk Assessment/Calculations:          Physical Exam:   VS:  There were no vitals taken for this visit.   No BP recorded.  {Refresh Note OR Click here to enter BP  :1}***   Wt Readings from Last 3 Encounters:  02/16/23 223 lb 12.8 oz (101.5 kg)  02/11/23 220 lb (99.8 kg)  06/17/15 185 lb (83.9 kg)      GENERAL:  No apparent distress, AOx3 HEENT:  No carotid bruits, +2 carotid impulses, no scleral icterus CAR: RRR no murmurs, gallops, rubs, or thrills RES:  Clear to auscultation bilaterally ABD:  Soft, nontender, nondistended, positive bowel sounds x  4 VASC:  +2 radial pulses, +2 carotid pulses NEURO:  CN 2-12 grossly intact; motor and sensory grossly intact PSYCH:  No active depression or anxiety EXT:  No edema, ecchymosis, or cyanosis  Signed, Elleen Coulibaly K Roen Macgowan, MD  07/27/2023 7:41 AM    [facility] [address] [place], [place], [place]  [address] Phone: [phone]; Fax: [phone]   Note:  This document was prepared using Dragon voice recognition software and may include unintentional dictation errors.

## 2023-07-31 ENCOUNTER — Ambulatory Visit: Payer: Medicaid Other | Attending: Internal Medicine | Admitting: Internal Medicine

## 2023-07-31 DIAGNOSIS — R072 Precordial pain: Secondary | ICD-10-CM

## 2023-07-31 DIAGNOSIS — R001 Bradycardia, unspecified: Secondary | ICD-10-CM

## 2023-07-31 DIAGNOSIS — I517 Cardiomegaly: Secondary | ICD-10-CM

## 2024-01-06 DIAGNOSIS — R6884 Jaw pain: Secondary | ICD-10-CM | POA: Diagnosis not present

## 2024-01-06 DIAGNOSIS — K047 Periapical abscess without sinus: Secondary | ICD-10-CM | POA: Diagnosis not present
